# Patient Record
Sex: Female | Born: 1985 | Hispanic: Yes | Marital: Single | Smoking: Never smoker
Health system: Southern US, Community
[De-identification: ages and names within clinical notes are randomized; demographics above are authoritative.]

## PROBLEM LIST (undated history)

## (undated) ENCOUNTER — Inpatient Hospital Stay (HOSPITAL_COMMUNITY): Payer: Self-pay

## (undated) DIAGNOSIS — R87619 Unspecified abnormal cytological findings in specimens from cervix uteri: Secondary | ICD-10-CM

## (undated) DIAGNOSIS — O24419 Gestational diabetes mellitus in pregnancy, unspecified control: Secondary | ICD-10-CM

## (undated) DIAGNOSIS — Q512 Other doubling of uterus, unspecified: Secondary | ICD-10-CM

---

## 2011-06-07 ENCOUNTER — Ambulatory Visit (INDEPENDENT_AMBULATORY_CARE_PROVIDER_SITE_OTHER): Payer: BC Managed Care – PPO | Admitting: Internal Medicine

## 2011-06-07 VITALS — BP 101/67 | HR 71 | Temp 98.3°F | Resp 16 | Ht 59.75 in | Wt 114.4 lb

## 2011-06-07 DIAGNOSIS — N912 Amenorrhea, unspecified: Secondary | ICD-10-CM

## 2011-06-07 LAB — POCT URINE PREGNANCY: Preg Test, Ur: POSITIVE

## 2011-06-07 NOTE — Progress Notes (Signed)
  Subjective:    Patient ID: Samantha Suarez, female    DOB: 10-22-1985, 26 y.o.   MRN: [id number]  HPI26 year old/last menstrual period March 1/had some spotting for 3 days last week with mild cramping but all of that resolved/home pregnancy test today positive/here for confirmation/she does desire to be pregnant and has never been pregnant before No nausea vomiting or breast tenderness    Review of SystemsHealthy on no medication/nonsmoker/almost no alcohol/no illicit drugs   GYN in [place] Objective:   Physical Exam Vital signs stable Affect appropriate       Results for orders placed in visit on 06/07/11  POCT URINE PREGNANCY      Component Value Range   Preg Test, Ur Positive      Assessment & Plan:  Problem #1 amenorrhea with positive pregnancy test  Because of the cramping and spotting last week we will confirm this with a quantitative hCG at her request and then we can use this as a comparison if she develops other bleeding during this first trimester She'll start prenatal vitamins and follow up with her OB/GYN

## 2011-06-09 ENCOUNTER — Telehealth: Payer: Self-pay

## 2011-06-09 NOTE — Telephone Encounter (Signed)
Since you something by result note from the lab section/see if that worked as well as this note/ You can tell her that this level is consistent with the first week of pregnancy If she has continued bleeding or cramping then it might represent a spontaneous miscarriage and we should repeat this lab test after one week

## 2011-06-09 NOTE — Telephone Encounter (Signed)
PT HAD A PREGNANCY TEST DONE ON Saturday AND WOULD LIKE TO KNOW IF THE RESULTS WERE BACK PLEASE CALL [phone]

## 2011-06-09 NOTE — Telephone Encounter (Signed)
Dr. Merla Riches,  Please review and we can notify patient.

## 2012-03-08 LAB — OB RESULTS CONSOLE GC/CHLAMYDIA: Chlamydia: NEGATIVE

## 2012-04-07 LAB — OB RESULTS CONSOLE HEPATITIS B SURFACE ANTIGEN: Hepatitis B Surface Ag: NEGATIVE

## 2012-04-07 LAB — OB RESULTS CONSOLE RUBELLA ANTIBODY, IGM: Rubella: IMMUNE

## 2012-07-30 ENCOUNTER — Other Ambulatory Visit: Payer: Self-pay

## 2012-08-04 ENCOUNTER — Encounter (HOSPITAL_COMMUNITY): Payer: Self-pay

## 2012-08-04 ENCOUNTER — Ambulatory Visit (HOSPITAL_COMMUNITY)
Admission: RE | Admit: 2012-08-04 | Discharge: 2012-08-04 | Disposition: A | Discharge status: Home / Self Care | Payer: BC Managed Care – PPO | Source: Ambulatory Visit | Attending: Obstetrics and Gynecology | Admitting: Obstetrics and Gynecology

## 2012-08-04 ENCOUNTER — Ambulatory Visit (HOSPITAL_COMMUNITY)
Admission: RE | Admit: 2012-08-04 | Discharge: 2012-08-04 | Disposition: A | Discharge status: Home / Self Care | Payer: Medicaid Other | Source: Ambulatory Visit | Attending: Obstetrics and Gynecology | Admitting: Obstetrics and Gynecology

## 2012-08-04 ENCOUNTER — Other Ambulatory Visit (HOSPITAL_COMMUNITY): Payer: Self-pay | Admitting: Obstetrics and Gynecology

## 2012-08-04 VITALS — BP 96/54 | HR 80 | Wt 148.5 lb

## 2012-08-04 DIAGNOSIS — O358XX0 Maternal care for other (suspected) fetal abnormality and damage, not applicable or unspecified: Secondary | ICD-10-CM | POA: Insufficient documentation

## 2012-08-04 DIAGNOSIS — O409XX1 Polyhydramnios, unspecified trimester, fetus 1: Secondary | ICD-10-CM

## 2012-08-04 DIAGNOSIS — Z36 Encounter for antenatal screening of mother: Secondary | ICD-10-CM

## 2012-08-04 DIAGNOSIS — Z363 Encounter for antenatal screening for malformations: Secondary | ICD-10-CM | POA: Insufficient documentation

## 2012-08-04 DIAGNOSIS — Z1389 Encounter for screening for other disorder: Secondary | ICD-10-CM | POA: Insufficient documentation

## 2012-08-04 DIAGNOSIS — O409XX0 Polyhydramnios, unspecified trimester, not applicable or unspecified: Secondary | ICD-10-CM | POA: Insufficient documentation

## 2012-08-04 DIAGNOSIS — O403XX1 Polyhydramnios, third trimester, fetus 1: Secondary | ICD-10-CM

## 2012-08-04 DIAGNOSIS — Q519 Congenital malformation of uterus and cervix, unspecified: Secondary | ICD-10-CM

## 2012-08-04 NOTE — Consult Note (Signed)
MFM consult   27 yr old G2P0010 at [redacted]w[redacted]d referred by Dr. Henderson Cloud for fetal anatomic survey and consult secondary to polyhydramnios seen on outside ultrasound. Patient with a vaginal septum.  Ultrasound today shows: estimated fetal weight is in the 54th%. Posterior placenta without evidence of previa. Polyhydramnios with amniotic fluid index of 28.3cm. Normal transabdominal cervical length. The views of the right ventricular outflow tract and nasal bone are limited. The duodenum appears prominent. The remainder of the limited anatomy survey is normal. Normal biophysical profile of 8/8. Normal middle cerebral artery Doppler studies. Uterus appears either septate or bicornuate.  I counseled the patient as follows:  1. Appropriate fetal growth. 2. Limited anatomy survey: - will reattempt on follow up ultrasound 3. Polyhydramnios: Discussed the most common etiology is idiopathic- 60% of cases. Other possible etiologies include: diabetes (patient had normal one hour glucola test last week- per patient), fetal malformations, genetic abnormalities, fetal neuromuscular disorders, fetal anemia, hydrops (immune or nonimmune), or congenital fetal infection. I discussed that there is normal fetal movement which makes a neuromuscular disorder less likely. Discussed that the prominent duodenum may raise concern for intestinal atresia or obstruction. Discussed diagnosis is not certain but some images are suggestive. Also discussed if this is duodenal atresia this increases the risks of trisomy 76. Patient did not have aneuploidy screening. Discussed options of cell free fetal DNA screening and amniocentesis and the limitations, benefits, and risks of each. After counseling patient opted for cell free fetal DNA which was drawn today. There are no other signs of fetal anemia or hydrops. There are no other fluid collections and the middle cerebral artery Doppler studies are normal. Given the above this is most likely  idiopathic polyhydramnios or possible intestinal atresia/obstruction. However, patient has had infectious serologies drawn by her primary OB; recommend follow up on results.  I discussed polyhydramnios is associated with increased risk of preterm labor, preterm premature rupture of membranes, preterm delivery, uterine atony, cord prolapse, fetal malpresentation, fetal macrosomia, and perinatal mortality. I gave the patient preterm labor precautions.  Given the increase in perinatal mortality I recommend antenatal testing with weekly BPPs until 32 weeks than may switch to twice weekly NSTs with weekly AFI. Recommend repeat ultrasound for fetal growth in 3-4 weeks.  Recommend fetal kick counts. 4. Vaginal septum/ uterine anomaly: I discussed the increased risks in pregnancy associated with uterine anomalies including and increased risk of: - miscarriage - preterm delivery- approximately 20% - intrauterine growth restriction - antepartum and postpartum bleeding - cervical incompetence - abnormal fetal presentation - cesarean delivery  We would recommend close surveillance in pregnancy for signs/symptoms of preterm labor or PPROM. Would recommend serial fetal growth ultrasounds every 4 weeks The vaginal septum should not interfere with a vaginal delivery although recommend extensive physical exam. 5. Follow up in 1 week; fetal growth in 4 weeks.  I spent 45 minutes in face to face consultation with the patient in addition to time spent on the ultrasound.  Eulis Foster, MD

## 2012-08-04 NOTE — Progress Notes (Signed)
[facility] ultrasound  Indication: 27 yr old G2P0010 at [redacted]w[redacted]d for fetal anatomic survey secondary to polyhydramnios seen on outside ultrasound. Patient with a vaginal septum.  Findings: 1. Single intrauterine pregnancy. 2. Estimated fetal weight is in the 54th%. 3. Posterior placenta without evidence of previa. 4. Polyhydramnios with amniotic fluid index of 28.3cm. 5. Normal transabdominal cervical length. 6. The views of the right ventricular outflow tract and nasal bone are limited. 7. The duodenum appears prominent. 8. The remainder of the limited anatomy survey is normal. 9. Normal biophysical profile of 8/8. 10. Normal middle cerebral artery Doppler studies. 11. Uterus appears either septate or bicornuate.  Recommendations: 1. Appropriate fetal growth. 2. Limited anatomy survey: - will reattempt on follow up ultrasound 3. Polyhydramnios: Discussed the most common etiology is idiopathic- 60% of cases. Other possible etiologies include: diabetes (patient had normal one hour glucola test last week- per patient), fetal malformations, genetic abnormalities, fetal neuromuscular disorders, fetal anemia, hydrops (immune or nonimmune), or congenital fetal infection. I discussed that there is normal fetal movement which makes a neuromuscular disorder less likely. Discussed that the prominent duodenum may raise concern for intestinal atresia or obstruction. Discussed diagnosis is not certain but some images are suggestive. Also discussed if this is duodenal atresia this increases the risks of trisomy 39. Patient did not have aneuploidy screening. Discussed options of cell free fetal DNA screening and amniocentesis and the limitations, benefits, and risks of each. After counseling patient opted for cell free fetal DNA which was drawn today. There are no other signs of fetal anemia or hydrops. There are no other fluid collections and the middle cerebral artery Doppler studies are  normal. Given the above this is most likely idiopathic polyhydramnios or possible intestinal atresia/obstruction. However, patient has had infectious serologies drawn by her primary OB; recommend follow up on results.  I discussed polyhydramnios is associated with increased risk of preterm labor, preterm premature rupture of membranes, preterm delivery, uterine atony, cord prolapse, fetal malpresentation, fetal macrosomia, and perinatal mortality. I gave the patient preterm labor precautions.  Given the increase in perinatal mortality I recommend antenatal testing with weekly BPPs until 32 weeks than may switch to twice weekly NSTs with weekly AFI. Recommend repeat ultrasound for fetal growth in 3-4 weeks.  Recommend fetal kick counts. 4. Vaginal septum/ uterine anomaly: I discussed the increased risks in pregnancy associated with uterine anomalies including and increased risk of: - miscarriage - preterm delivery- approximately 20% - intrauterine growth restriction - antepartum and postpartum bleeding - cervical incompetence - abnormal fetal presentation - cesarean delivery  We would recommend close surveillance in pregnancy for signs/symptoms of preterm labor or PPROM. Would recommend serial fetal growth ultrasounds every 4 weeks The vaginal septum should not interfere with a vaginal delivery although recommend extensive physical exam. 5. Follow up in 1 week; fetal growth in 4 weeks.  Eulis Foster, MD

## 2012-08-10 ENCOUNTER — Encounter (HOSPITAL_COMMUNITY): Payer: Self-pay

## 2012-08-12 ENCOUNTER — Telehealth (HOSPITAL_COMMUNITY): Payer: Self-pay

## 2012-08-12 ENCOUNTER — Other Ambulatory Visit: Payer: Self-pay

## 2012-08-12 NOTE — Telephone Encounter (Signed)
Called [facility] to discuss her cell free fetal DNA test results.  Mrs. Samantha Suarez had Panorama testing through [facility].  Testing was offered because of abnormal ultrasound findings.   The patient was identified by name and DOB.  We reviewed that these are within normal limits, showing a less than 1 in 10,000 risk for trisomies 21, 18 and 13, and monosomy X (Turner syndrome).  In addition, the risk for triploidy/vanishing twin and sex chromosome trisomies (47,XXX and 47,XXY) was also low risk.  We discussed that this testing identifies > 99% of pregnancies with trisomy 81, trisomy 14, trisomy 6, sex chromosome trisomies (47,XXX and 47,XXY), and triploidy.  The detection rate for monosomy X is ~92%.  The false positive rate is <0.1% for all conditions. Testing was also consistent with female gender.  She understands that this testing does not identify all genetic conditions.  All questions were answered to her satisfaction, she was encouraged to call with additional questions or concerns.  Despina Arias, MS Certified Genetic Counselor

## 2012-08-13 ENCOUNTER — Ambulatory Visit (HOSPITAL_COMMUNITY)
Admission: RE | Admit: 2012-08-13 | Discharge: 2012-08-13 | Disposition: A | Discharge status: Home / Self Care | Payer: BC Managed Care – PPO | Source: Ambulatory Visit | Attending: Obstetrics and Gynecology | Admitting: Obstetrics and Gynecology

## 2012-08-13 ENCOUNTER — Encounter (HOSPITAL_COMMUNITY): Payer: Self-pay

## 2012-08-13 DIAGNOSIS — Z3689 Encounter for other specified antenatal screening: Secondary | ICD-10-CM | POA: Insufficient documentation

## 2012-08-13 DIAGNOSIS — O409XX0 Polyhydramnios, unspecified trimester, not applicable or unspecified: Secondary | ICD-10-CM | POA: Insufficient documentation

## 2012-08-13 DIAGNOSIS — O409XX1 Polyhydramnios, unspecified trimester, fetus 1: Secondary | ICD-10-CM

## 2012-08-13 DIAGNOSIS — O401XX1 Polyhydramnios, first trimester, fetus 1: Secondary | ICD-10-CM

## 2012-08-13 NOTE — Progress Notes (Signed)
Samantha Suarez  was seen today for an ultrasound appointment.  See full report in [facility].  Impression: Single IUP at 30 6/7 weeks Polyhydramnios (likely idiopathic), low risk cell-free fetal DNA Active fetus - BPP 8/10 (-2 for absent breathing movement); NST reactive Normal appearing stomach; no clear evidence of bowel obstruction on today's study AFI 25.8 cm  Recommendations: Continue weekly BPPs NSTs with weekly fluid checks starting at 32 weeks. Follow up growth scan in 3 weeks.  Alpha Gula, MD

## 2012-08-16 ENCOUNTER — Other Ambulatory Visit (HOSPITAL_COMMUNITY): Payer: Self-pay | Admitting: Obstetrics and Gynecology

## 2012-08-16 DIAGNOSIS — O409XX1 Polyhydramnios, unspecified trimester, fetus 1: Secondary | ICD-10-CM

## 2012-08-19 ENCOUNTER — Encounter (HOSPITAL_COMMUNITY): Payer: Self-pay

## 2012-08-19 ENCOUNTER — Ambulatory Visit (HOSPITAL_COMMUNITY)
Admission: RE | Admit: 2012-08-19 | Discharge: 2012-08-19 | Disposition: A | Discharge status: Home / Self Care | Payer: BC Managed Care – PPO | Source: Ambulatory Visit | Attending: Obstetrics and Gynecology | Admitting: Obstetrics and Gynecology

## 2012-08-19 VITALS — BP 97/57 | HR 83 | Wt 150.0 lb

## 2012-08-19 DIAGNOSIS — O409XX0 Polyhydramnios, unspecified trimester, not applicable or unspecified: Secondary | ICD-10-CM | POA: Insufficient documentation

## 2012-08-19 DIAGNOSIS — O409XX1 Polyhydramnios, unspecified trimester, fetus 1: Secondary | ICD-10-CM

## 2012-08-19 DIAGNOSIS — Z3689 Encounter for other specified antenatal screening: Secondary | ICD-10-CM | POA: Insufficient documentation

## 2012-08-24 ENCOUNTER — Other Ambulatory Visit (HOSPITAL_COMMUNITY): Payer: Self-pay | Admitting: Obstetrics and Gynecology

## 2012-08-24 DIAGNOSIS — O409XX1 Polyhydramnios, unspecified trimester, fetus 1: Secondary | ICD-10-CM

## 2012-08-27 ENCOUNTER — Ambulatory Visit (HOSPITAL_COMMUNITY): Admission: RE | Admit: 2012-08-27 | Payer: BC Managed Care – PPO | Source: Ambulatory Visit

## 2012-08-31 ENCOUNTER — Other Ambulatory Visit (HOSPITAL_COMMUNITY): Payer: Self-pay | Admitting: Obstetrics and Gynecology

## 2012-08-31 DIAGNOSIS — O401XX1 Polyhydramnios, first trimester, fetus 1: Secondary | ICD-10-CM

## 2012-09-01 ENCOUNTER — Other Ambulatory Visit (HOSPITAL_COMMUNITY): Payer: Self-pay | Admitting: Obstetrics and Gynecology

## 2012-09-01 DIAGNOSIS — O401XX1 Polyhydramnios, first trimester, fetus 1: Secondary | ICD-10-CM

## 2012-09-02 ENCOUNTER — Encounter (HOSPITAL_COMMUNITY): Payer: Self-pay

## 2012-09-02 ENCOUNTER — Ambulatory Visit (HOSPITAL_COMMUNITY)
Admission: RE | Admit: 2012-09-02 | Discharge: 2012-09-02 | Disposition: A | Discharge status: Home / Self Care | Payer: BC Managed Care – PPO | Source: Ambulatory Visit | Attending: Obstetrics and Gynecology | Admitting: Obstetrics and Gynecology

## 2012-09-02 DIAGNOSIS — O409XX0 Polyhydramnios, unspecified trimester, not applicable or unspecified: Secondary | ICD-10-CM | POA: Insufficient documentation

## 2012-09-02 DIAGNOSIS — Z3689 Encounter for other specified antenatal screening: Secondary | ICD-10-CM | POA: Insufficient documentation

## 2012-09-02 DIAGNOSIS — O401XX1 Polyhydramnios, first trimester, fetus 1: Secondary | ICD-10-CM

## 2012-09-08 ENCOUNTER — Other Ambulatory Visit (HOSPITAL_COMMUNITY): Payer: Self-pay | Admitting: Obstetrics and Gynecology

## 2012-09-08 DIAGNOSIS — O409XX1 Polyhydramnios, unspecified trimester, fetus 1: Secondary | ICD-10-CM

## 2012-09-10 ENCOUNTER — Encounter (HOSPITAL_COMMUNITY): Payer: Self-pay

## 2012-09-10 ENCOUNTER — Ambulatory Visit (HOSPITAL_COMMUNITY)
Admission: RE | Admit: 2012-09-10 | Discharge: 2012-09-10 | Disposition: A | Discharge status: Home / Self Care | Payer: BC Managed Care – PPO | Source: Ambulatory Visit | Attending: Obstetrics and Gynecology | Admitting: Obstetrics and Gynecology

## 2012-09-10 VITALS — BP 105/66 | HR 79 | Wt 153.0 lb

## 2012-09-10 DIAGNOSIS — O409XX1 Polyhydramnios, unspecified trimester, fetus 1: Secondary | ICD-10-CM

## 2012-09-10 DIAGNOSIS — Z3689 Encounter for other specified antenatal screening: Secondary | ICD-10-CM | POA: Insufficient documentation

## 2012-09-10 DIAGNOSIS — O409XX0 Polyhydramnios, unspecified trimester, not applicable or unspecified: Secondary | ICD-10-CM | POA: Insufficient documentation

## 2012-09-13 LAB — OB RESULTS CONSOLE GBS: GBS: NEGATIVE

## 2012-09-14 ENCOUNTER — Other Ambulatory Visit (HOSPITAL_COMMUNITY): Payer: Self-pay | Admitting: Obstetrics and Gynecology

## 2012-09-14 DIAGNOSIS — O403XX1 Polyhydramnios, third trimester, fetus 1: Secondary | ICD-10-CM

## 2012-09-17 ENCOUNTER — Ambulatory Visit (HOSPITAL_COMMUNITY)
Admission: RE | Admit: 2012-09-17 | Discharge: 2012-09-17 | Disposition: A | Discharge status: Home / Self Care | Payer: BC Managed Care – PPO | Source: Ambulatory Visit | Attending: Obstetrics and Gynecology | Admitting: Obstetrics and Gynecology

## 2012-09-17 VITALS — BP 107/67 | HR 70 | Wt 153.0 lb

## 2012-09-17 DIAGNOSIS — Z3689 Encounter for other specified antenatal screening: Secondary | ICD-10-CM | POA: Insufficient documentation

## 2012-09-17 DIAGNOSIS — O409XX0 Polyhydramnios, unspecified trimester, not applicable or unspecified: Secondary | ICD-10-CM | POA: Insufficient documentation

## 2012-09-17 DIAGNOSIS — O403XX1 Polyhydramnios, third trimester, fetus 1: Secondary | ICD-10-CM

## 2012-09-24 ENCOUNTER — Ambulatory Visit (HOSPITAL_COMMUNITY)
Admission: RE | Admit: 2012-09-24 | Discharge: 2012-09-24 | Disposition: A | Discharge status: Home / Self Care | Payer: BC Managed Care – PPO | Source: Ambulatory Visit | Attending: Obstetrics and Gynecology | Admitting: Obstetrics and Gynecology

## 2012-09-24 VITALS — BP 120/67 | HR 77 | Wt 155.0 lb

## 2012-09-24 DIAGNOSIS — O409XX0 Polyhydramnios, unspecified trimester, not applicable or unspecified: Secondary | ICD-10-CM | POA: Insufficient documentation

## 2012-09-24 DIAGNOSIS — Z3689 Encounter for other specified antenatal screening: Secondary | ICD-10-CM | POA: Insufficient documentation

## 2012-09-24 DIAGNOSIS — O403XX1 Polyhydramnios, third trimester, fetus 1: Secondary | ICD-10-CM

## 2012-09-24 NOTE — Progress Notes (Signed)
Samantha Suarez  was seen today for an ultrasound appointment.  See full report in [facility].  Impression: IUP at 36+6 weeks  MIld polyhydramnios (AFI = 30 cm) Active fetus with BPP 8/8   Recommendations: Follow up next week for BPP and growth. Continue antepartum fetal testing - recommend delivery at 39 weeeks.  Alpha Gula, MD

## 2012-09-29 ENCOUNTER — Telehealth (HOSPITAL_COMMUNITY): Payer: Self-pay | Admitting: *Deleted

## 2012-09-29 ENCOUNTER — Encounter (HOSPITAL_COMMUNITY): Payer: Self-pay | Admitting: *Deleted

## 2012-09-29 ENCOUNTER — Other Ambulatory Visit (HOSPITAL_COMMUNITY): Payer: Self-pay | Admitting: Maternal and Fetal Medicine

## 2012-09-29 DIAGNOSIS — O403XX1 Polyhydramnios, third trimester, fetus 1: Secondary | ICD-10-CM

## 2012-09-29 NOTE — Telephone Encounter (Signed)
Preadmission screen  

## 2012-10-01 ENCOUNTER — Ambulatory Visit (HOSPITAL_COMMUNITY)
Admission: RE | Admit: 2012-10-01 | Discharge: 2012-10-01 | Disposition: A | Discharge status: Home / Self Care | Payer: BC Managed Care – PPO | Source: Ambulatory Visit | Attending: Obstetrics and Gynecology | Admitting: Obstetrics and Gynecology

## 2012-10-01 VITALS — BP 112/70 | HR 70 | Wt 154.8 lb

## 2012-10-01 DIAGNOSIS — O409XX0 Polyhydramnios, unspecified trimester, not applicable or unspecified: Secondary | ICD-10-CM | POA: Insufficient documentation

## 2012-10-01 DIAGNOSIS — O403XX1 Polyhydramnios, third trimester, fetus 1: Secondary | ICD-10-CM

## 2012-10-01 DIAGNOSIS — Z3689 Encounter for other specified antenatal screening: Secondary | ICD-10-CM | POA: Insufficient documentation

## 2012-10-01 NOTE — Progress Notes (Signed)
Samantha Suarez  was seen today for an ultrasound appointment.  See full report in [facility].  Impression: IUP at 37+6 weeks  MIld polyhydramnios (AFI = 30 cm) - likely idiopathic Interval growth is appropriate (76th %tile) Stomach bubble appears normal.  No dilated loops of bowel - no obvious cause of polyhydramnios. Active fetus with BPP 8/8   Recommendations: Continue antepartum fetal testing as scheduled - to return for BPP next week Tentatively scheduled for induction of labor at 39 weeks.  Alpha Gula, MD

## 2012-10-03 ENCOUNTER — Inpatient Hospital Stay (HOSPITAL_COMMUNITY)
Admission: AD | Admit: 2012-10-03 | Discharge: 2012-10-03 | Disposition: A | Discharge status: Home / Self Care | Payer: BC Managed Care – PPO | Source: Ambulatory Visit | Attending: Obstetrics and Gynecology | Admitting: Obstetrics and Gynecology

## 2012-10-03 ENCOUNTER — Encounter (HOSPITAL_COMMUNITY): Payer: Self-pay | Admitting: *Deleted

## 2012-10-03 ENCOUNTER — Inpatient Hospital Stay (HOSPITAL_COMMUNITY): Payer: BC Managed Care – PPO

## 2012-10-03 DIAGNOSIS — O409XX0 Polyhydramnios, unspecified trimester, not applicable or unspecified: Secondary | ICD-10-CM | POA: Insufficient documentation

## 2012-10-03 DIAGNOSIS — L299 Pruritus, unspecified: Secondary | ICD-10-CM | POA: Insufficient documentation

## 2012-10-03 DIAGNOSIS — L309 Dermatitis, unspecified: Secondary | ICD-10-CM

## 2012-10-03 DIAGNOSIS — O9989 Other specified diseases and conditions complicating pregnancy, childbirth and the puerperium: Secondary | ICD-10-CM | POA: Insufficient documentation

## 2012-10-03 MED ORDER — PREDNISONE (PAK) 10 MG PO TABS: 10.0000 mg | ORAL_TABLET | Freq: Every day | ORAL | Status: DC

## 2012-10-03 MED ORDER — HYDROCORTISONE 1 % EX LOTN: TOPICAL_LOTION | Freq: Two times a day (BID) | CUTANEOUS | Status: DC

## 2012-10-03 NOTE — MAU Provider Note (Signed)
  History     CSN: [id number]  Arrival date and time: 10/03/12 [facility]   None     Chief Complaint  Patient presents with  . Rash   HPI This is a 27 y.o. female at [redacted]w[redacted]d who presents with c/o rash that first appeared on 7/29.  States is spreading. Started on abdomen and now has small areas on legs and arms. + itching. Has been followed for mild polyhydramnios, believed to be idiopathic IOL planned for 39 weeks. Wants to be induced today  RN Note: Patient states she has had a rash since 7-29 that is getting worse and spreading all over her body. Reports good fetal movement, maybe having some contractions, no bleeding or leaking.       OB History   Grav Para Term Preterm Abortions TAB SAB Ect Mult Living   2 0 0 0 1 0 1 0 0 0       Past Medical History  Diagnosis Date  . Medical history non-contributory     Past Surgical History  Procedure Laterality Date  . No past surgeries      No family history on file.  History  Substance Use Topics  . Smoking status: Never Smoker   . Smokeless tobacco: Never Used  . Alcohol Use: No    Allergies: No Known Allergies  No prescriptions prior to admission    Review of Systems  Constitutional: Positive for malaise/fatigue. Negative for fever and chills.  Gastrointestinal: Negative for nausea, vomiting, abdominal pain, diarrhea and constipation.  Genitourinary: Negative for dysuria.  Skin: Positive for itching and rash.  Neurological: Negative for headaches.   Physical Exam   Blood pressure 120/71, pulse 72, temperature 98 F (36.7 C), temperature source Oral, resp. rate 18, height 4\' 8"  (1.422 m), weight 70.943 kg (156 lb 6.4 oz), last menstrual period 01/10/2012.  Physical Exam  Constitutional: She is oriented to person, place, and time. She appears well-developed and well-nourished. No distress.  HENT:  Head: Normocephalic.  Cardiovascular: Normal rate and regular rhythm.  Exam reveals no gallop and no friction rub.   No  murmur heard. Respiratory: Effort normal and breath sounds normal. No respiratory distress. She has no wheezes. She has no rales. She exhibits no tenderness.  O2 saturation 100% on room air  GI: Soft. There is no tenderness.  Musculoskeletal: Normal range of motion. She exhibits no edema.  Neurological: She is alert and oriented to person, place, and time.  Skin: Skin is warm and dry. Rash noted.  Maculopapular rash on abdomen, mostly involving striae Small papules on thighs Has a few scattered papules on arms No rash on back   Psychiatric: She has a normal mood and affect.   Fetal heart rate reactive Rare contractions  MAU Course  Procedures  MDM Discussed with Dr Henderson Cloud. Will check [place] for AFI and BPP.  >> BPP 8/8 with AFI = 27   Assessment and Plan  A:  SIUP at [redacted]w[redacted]d        PUPPS rash       Polyhydramnios       Reassuring fetal testing  P:  Discussed with Dr Henderson Cloud       Discharge home       Keep appt with office Tuesday         [facility] 10/03/2012, 8:47 AM

## 2012-10-03 NOTE — MAU Note (Signed)
Patient states she has had a rash since 7-29 that is getting worse and spreading all over her body. Reports good fetal movement, maybe having some contractions, no bleeding or leaking.

## 2012-10-08 ENCOUNTER — Ambulatory Visit (HOSPITAL_COMMUNITY)
Admission: RE | Admit: 2012-10-08 | Discharge: 2012-10-08 | Disposition: A | Discharge status: Home / Self Care | Payer: BC Managed Care – PPO | Source: Ambulatory Visit | Attending: Obstetrics and Gynecology | Admitting: Obstetrics and Gynecology

## 2012-10-08 ENCOUNTER — Other Ambulatory Visit (HOSPITAL_COMMUNITY): Payer: Self-pay | Admitting: Maternal and Fetal Medicine

## 2012-10-08 DIAGNOSIS — O403XX1 Polyhydramnios, third trimester, fetus 1: Secondary | ICD-10-CM

## 2012-10-08 DIAGNOSIS — O409XX0 Polyhydramnios, unspecified trimester, not applicable or unspecified: Secondary | ICD-10-CM | POA: Insufficient documentation

## 2012-10-08 DIAGNOSIS — Z3689 Encounter for other specified antenatal screening: Secondary | ICD-10-CM | POA: Insufficient documentation

## 2012-10-10 ENCOUNTER — Inpatient Hospital Stay (HOSPITAL_COMMUNITY)
Admission: RE | Admit: 2012-10-10 | Discharge: 2012-10-14 | DRG: 371 | Disposition: A | Discharge status: Home / Self Care | Payer: BC Managed Care – PPO | Source: Ambulatory Visit | Attending: Obstetrics and Gynecology | Admitting: Obstetrics and Gynecology

## 2012-10-10 ENCOUNTER — Inpatient Hospital Stay (HOSPITAL_COMMUNITY): Payer: BC Managed Care – PPO

## 2012-10-10 ENCOUNTER — Encounter (HOSPITAL_COMMUNITY): Payer: Self-pay

## 2012-10-10 DIAGNOSIS — O324XX0 Maternal care for high head at term, not applicable or unspecified: Secondary | ICD-10-CM | POA: Diagnosis present

## 2012-10-10 DIAGNOSIS — Q5211 Transverse vaginal septum: Secondary | ICD-10-CM

## 2012-10-10 DIAGNOSIS — O409XX0 Polyhydramnios, unspecified trimester, not applicable or unspecified: Principal | ICD-10-CM | POA: Diagnosis present

## 2012-10-10 DIAGNOSIS — O3460 Maternal care for abnormality of vagina, unspecified trimester: Secondary | ICD-10-CM | POA: Diagnosis present

## 2012-10-10 LAB — CBC
MCV: 89.1 fL (ref 78.0–100.0)
Platelets: 234 10*3/uL (ref 150–400)
RDW: 13.3 % (ref 11.5–15.5)
WBC: 10.2 10*3/uL (ref 4.0–10.5)

## 2012-10-10 MED ORDER — ZOLPIDEM TARTRATE 5 MG PO TABS
5.0000 mg | ORAL_TABLET | Freq: Every evening | ORAL | Status: DC | PRN
  Administered 2012-10-10: 5 mg via ORAL
  Filled 2012-10-10: qty 1

## 2012-10-10 MED ORDER — LACTATED RINGERS IV SOLN
INTRAVENOUS | Status: DC
  Administered 2012-10-10 – 2012-10-12 (×6): via INTRAVENOUS

## 2012-10-10 MED ORDER — ACETAMINOPHEN 325 MG PO TABS: 650.0000 mg | ORAL_TABLET | ORAL | Status: DC | PRN

## 2012-10-10 MED ORDER — BUTORPHANOL TARTRATE 1 MG/ML IJ SOLN: 1.0000 mg | INTRAMUSCULAR | Status: DC | PRN

## 2012-10-10 MED ORDER — ONDANSETRON HCL 4 MG/2ML IJ SOLN: 4.0000 mg | Freq: Four times a day (QID) | INTRAMUSCULAR | Status: DC | PRN

## 2012-10-10 MED ORDER — TERBUTALINE SULFATE 1 MG/ML IJ SOLN: 0.2500 mg | Freq: Once | INTRAMUSCULAR | Status: AC | PRN

## 2012-10-10 MED ORDER — LIDOCAINE HCL (PF) 1 % IJ SOLN
30.0000 mL | INTRAMUSCULAR | Status: DC | PRN
  Filled 2012-10-10: qty 30

## 2012-10-10 MED ORDER — OXYTOCIN 40 UNITS IN LACTATED RINGERS INFUSION - SIMPLE MED: 62.5000 mL/h | INTRAVENOUS | Status: DC

## 2012-10-10 MED ORDER — OXYCODONE-ACETAMINOPHEN 5-325 MG PO TABS: 1.0000 | ORAL_TABLET | ORAL | Status: DC | PRN

## 2012-10-10 MED ORDER — MISOPROSTOL 25 MCG QUARTER TABLET
25.0000 ug | ORAL_TABLET | ORAL | Status: DC | PRN
  Administered 2012-10-10: 25 ug via VAGINAL
  Filled 2012-10-10: qty 0.25

## 2012-10-10 MED ORDER — FLEET ENEMA 7-19 GM/118ML RE ENEM: 1.0000 | ENEMA | RECTAL | Status: DC | PRN

## 2012-10-10 MED ORDER — OXYTOCIN BOLUS FROM INFUSION: 500.0000 mL | INTRAVENOUS | Status: DC

## 2012-10-10 MED ORDER — LACTATED RINGERS IV SOLN
500.0000 mL | INTRAVENOUS | Status: DC | PRN
  Administered 2012-10-11: 300 mL via INTRAVENOUS

## 2012-10-10 MED ORDER — CITRIC ACID-SODIUM CITRATE 334-500 MG/5ML PO SOLN
30.0000 mL | ORAL | Status: DC | PRN
  Administered 2012-10-12: 30 mL via ORAL
  Filled 2012-10-10: qty 15

## 2012-10-10 MED ORDER — IBUPROFEN 600 MG PO TABS: 600.0000 mg | ORAL_TABLET | Freq: Four times a day (QID) | ORAL | Status: DC | PRN

## 2012-10-10 NOTE — H&P (Signed)
27 y.o. G2P0010  Estimated Date of Delivery: 10/16/12 admitted at 39/[redacted] weeks gestation for induction for mild, idiopathic polyhydramnios at the recommendation of MFM consult Verlin Fester, MD).  Prenatal Transfer Tool  Maternal Diabetes: No Genetic Screening: Declined Maternal Ultrasounds/Referrals: Mild polyhydramnios.  No fetal or maternal pathology noted. Fetal Ultrasounds or other Referrals: MFM consult for polyhydramnios. Maternal Substance Abuse:  No Significant Maternal Medications:  None Significant Maternal Lab Results: None Other Significant Pregnancy Complications:  None  Afebrile, VSS Heart and Lungs: No active disease Abdomen: soft, gravid, EFW 8 lbs. Cervical exam:  1/50  Impression: Mild polyhydramnios  Plan:  Induction at 39 weeks.

## 2012-10-10 NOTE — Plan of Care (Signed)
Problem: Consults Goal: Birthing Suites Patient Information Press F2 to bring up selections list Outcome: Completed/Met Date Met:  10/10/12  Pt 37-[redacted] weeks EGA and Inpatient induction

## 2012-10-11 ENCOUNTER — Inpatient Hospital Stay (HOSPITAL_COMMUNITY): Payer: BC Managed Care – PPO | Admitting: Anesthesiology

## 2012-10-11 ENCOUNTER — Encounter (HOSPITAL_COMMUNITY): Payer: Self-pay | Admitting: Anesthesiology

## 2012-10-11 LAB — RPR: RPR Ser Ql: NONREACTIVE

## 2012-10-11 LAB — TYPE AND SCREEN: Antibody Screen: NEGATIVE

## 2012-10-11 MED ORDER — PHENYLEPHRINE 40 MCG/ML (10ML) SYRINGE FOR IV PUSH (FOR BLOOD PRESSURE SUPPORT): 80.0000 ug | PREFILLED_SYRINGE | INTRAVENOUS | Status: DC | PRN

## 2012-10-11 MED ORDER — DIPHENHYDRAMINE HCL 50 MG/ML IJ SOLN: 12.5000 mg | INTRAMUSCULAR | Status: DC | PRN

## 2012-10-11 MED ORDER — FENTANYL 2.5 MCG/ML BUPIVACAINE 1/10 % EPIDURAL INFUSION (WH - ANES)
14.0000 mL/h | INTRAMUSCULAR | Status: DC | PRN
  Administered 2012-10-11 (×2): 14 mL/h via EPIDURAL
  Filled 2012-10-11 (×2): qty 125

## 2012-10-11 MED ORDER — EPHEDRINE 5 MG/ML INJ: 10.0000 mg | INTRAVENOUS | Status: DC | PRN

## 2012-10-11 MED ORDER — LIDOCAINE HCL (PF) 1 % IJ SOLN
INTRAMUSCULAR | Status: DC | PRN
  Administered 2012-10-11 (×2): 5 mL

## 2012-10-11 MED ORDER — TERBUTALINE SULFATE 1 MG/ML IJ SOLN: 0.2500 mg | Freq: Once | INTRAMUSCULAR | Status: AC | PRN

## 2012-10-11 MED ORDER — PHENYLEPHRINE 40 MCG/ML (10ML) SYRINGE FOR IV PUSH (FOR BLOOD PRESSURE SUPPORT)
80.0000 ug | PREFILLED_SYRINGE | INTRAVENOUS | Status: DC | PRN
  Filled 2012-10-11: qty 5

## 2012-10-11 MED ORDER — LACTATED RINGERS IV SOLN
500.0000 mL | Freq: Once | INTRAVENOUS | Status: AC
  Administered 2012-10-11: 500 mL via INTRAVENOUS

## 2012-10-11 MED ORDER — OXYTOCIN 40 UNITS IN LACTATED RINGERS INFUSION - SIMPLE MED: 1.0000 m[IU]/min | INTRAVENOUS | Status: DC

## 2012-10-11 MED ORDER — OXYTOCIN 40 UNITS IN LACTATED RINGERS INFUSION - SIMPLE MED
1.0000 m[IU]/min | INTRAVENOUS | Status: DC
  Administered 2012-10-11: 1 m[IU]/min via INTRAVENOUS
  Filled 2012-10-11: qty 1000

## 2012-10-11 MED ORDER — EPHEDRINE 5 MG/ML INJ
10.0000 mg | INTRAVENOUS | Status: DC | PRN
  Filled 2012-10-11: qty 4

## 2012-10-11 NOTE — Anesthesia Preprocedure Evaluation (Addendum)
Anesthesia Evaluation  Patient identified by MRN, date of birth, ID band Patient awake    Reviewed: Allergy & Precautions, H&P , NPO status , Patient's Chart, lab work & pertinent test results  Airway Mallampati: II TM Distance: >3 FB Neck ROM: full    Dental no notable dental hx.    Pulmonary neg pulmonary ROS,  breath sounds clear to auscultation  Pulmonary exam normal       Cardiovascular negative cardio ROS  Rhythm:regular Rate:Normal     Neuro/Psych negative neurological ROS  negative psych ROS   GI/Hepatic negative GI ROS, Neg liver ROS,   Endo/Other  negative endocrine ROS  Renal/GU negative Renal ROS     Musculoskeletal negative musculoskeletal ROS (+)   Abdominal   Peds  Hematology negative hematology ROS (+)   Anesthesia Other Findings   Reproductive/Obstetrics (+) Pregnancy                          Anesthesia Physical Anesthesia Plan  ASA: II and emergent  Anesthesia Plan: Epidural   Post-op Pain Management:    Induction:   Airway Management Planned:   Additional Equipment:   Intra-op Plan:   Post-operative Plan:   Informed Consent: I have reviewed the patients History and Physical, chart, labs and discussed the procedure including the risks, benefits and alternatives for the proposed anesthesia with the patient or authorized representative who has indicated his/her understanding and acceptance.     Plan Discussed with: Anesthesiologist, CRNA and Surgeon  Anesthesia Plan Comments: (Patient for C/Section for arrest of descent. )       Anesthesia Quick Evaluation

## 2012-10-11 NOTE — Anesthesia Procedure Notes (Signed)
Epidural Patient location during procedure: OB Start time: 10/11/2012 1:10 PM  Staffing Anesthesiologist: Angus Seller., [facility] R. Performed by: anesthesiologist   Preanesthetic Checklist Completed: patient identified, site marked, surgical consent, pre-op evaluation, timeout performed, IV checked, risks and benefits discussed and monitors and equipment checked  Epidural Patient position: sitting Prep: site prepped and draped and DuraPrep Patient monitoring: continuous pulse ox and blood pressure Approach: midline Injection technique: LOR air and LOR saline  Needle:  Needle type: Tuohy  Needle gauge: 17 G Needle length: 9 cm and 9 Needle insertion depth: 5 cm cm Catheter type: closed end flexible Catheter size: 19 Gauge Catheter at skin depth: 10 cm Test dose: negative  Assessment Events: blood not aspirated, injection not painful, no injection resistance, negative IV test and no paresthesia  Additional Notes Patient identified.  Risk benefits discussed including failed block, incomplete pain control, headache, nerve damage, paralysis, blood pressure changes, nausea, vomiting, reactions to medication both toxic or allergic, and postpartum back pain.  Patient expressed understanding and wished to proceed.  All questions were answered.  Sterile technique used throughout procedure and epidural site dressed with sterile barrier dressing. No paresthesia or other complications noted.The patient did not experience any signs of intravascular injection such as tinnitus or metallic taste in mouth nor signs of intrathecal spread such as rapid motor block. Please see nursing notes for vital signs.

## 2012-10-12 ENCOUNTER — Encounter (HOSPITAL_COMMUNITY)
Admission: RE | Disposition: A | Discharge status: Home / Self Care | Payer: Self-pay | Source: Ambulatory Visit | Attending: Obstetrics and Gynecology

## 2012-10-12 ENCOUNTER — Encounter (HOSPITAL_COMMUNITY): Payer: Self-pay

## 2012-10-12 LAB — CBC
HCT: 32.7 % — ABNORMAL LOW (ref 36.0–46.0)
MCV: 89.3 fL (ref 78.0–100.0)
Platelets: 181 10*3/uL (ref 150–400)
RBC: 3.66 MIL/uL — ABNORMAL LOW (ref 3.87–5.11)
WBC: 22.4 10*3/uL — ABNORMAL HIGH (ref 4.0–10.5)

## 2012-10-12 SURGERY — Surgical Case
Anesthesia: Epidural | Site: Abdomen | Wound class: Clean Contaminated

## 2012-10-12 MED ORDER — SODIUM CHLORIDE 0.9 % IJ SOLN: 3.0000 mL | INTRAMUSCULAR | Status: DC | PRN

## 2012-10-12 MED ORDER — ONDANSETRON HCL 4 MG/2ML IJ SOLN
INTRAMUSCULAR | Status: DC | PRN
  Administered 2012-10-12: 4 mg via INTRAVENOUS

## 2012-10-12 MED ORDER — MORPHINE SULFATE (PF) 0.5 MG/ML IJ SOLN
INTRAMUSCULAR | Status: DC | PRN
  Administered 2012-10-12: 1 mg via INTRAVENOUS

## 2012-10-12 MED ORDER — LACTATED RINGERS IV SOLN: INTRAVENOUS | Status: DC

## 2012-10-12 MED ORDER — TETANUS-DIPHTH-ACELL PERTUSSIS 5-2.5-18.5 LF-MCG/0.5 IM SUSP
0.5000 mL | Freq: Once | INTRAMUSCULAR | Status: AC
Start: 2012-10-13 — End: 2012-10-13
  Administered 2012-10-13: 0.5 mL via INTRAMUSCULAR

## 2012-10-12 MED ORDER — NALBUPHINE HCL 10 MG/ML IJ SOLN
5.0000 mg | INTRAMUSCULAR | Status: DC | PRN
  Filled 2012-10-12: qty 1

## 2012-10-12 MED ORDER — IBUPROFEN 600 MG PO TABS
600.0000 mg | ORAL_TABLET | Freq: Four times a day (QID) | ORAL | Status: DC
  Administered 2012-10-12 – 2012-10-14 (×8): 600 mg via ORAL
  Filled 2012-10-12 (×2): qty 1
  Filled 2012-10-12: qty 2
  Filled 2012-10-12 (×5): qty 1

## 2012-10-12 MED ORDER — LIDOCAINE-EPINEPHRINE (PF) 2 %-1:200000 IJ SOLN
INTRAMUSCULAR | Status: AC
  Filled 2012-10-12: qty 20

## 2012-10-12 MED ORDER — LACTATED RINGERS IV SOLN
INTRAVENOUS | Status: DC | PRN
  Administered 2012-10-12: 02:00:00 via INTRAVENOUS

## 2012-10-12 MED ORDER — SIMETHICONE 80 MG PO CHEW: 80.0000 mg | CHEWABLE_TABLET | ORAL | Status: DC | PRN

## 2012-10-12 MED ORDER — KETOROLAC TROMETHAMINE 30 MG/ML IJ SOLN
INTRAMUSCULAR | Status: AC
  Administered 2012-10-12: 30 mg via INTRAMUSCULAR
  Filled 2012-10-12: qty 1

## 2012-10-12 MED ORDER — MORPHINE SULFATE 0.5 MG/ML IJ SOLN
INTRAMUSCULAR | Status: AC
  Filled 2012-10-12: qty 10

## 2012-10-12 MED ORDER — CEFAZOLIN SODIUM-DEXTROSE 2-3 GM-% IV SOLR
INTRAVENOUS | Status: AC
  Filled 2012-10-12: qty 50

## 2012-10-12 MED ORDER — SCOPOLAMINE 1 MG/3DAYS TD PT72
MEDICATED_PATCH | TRANSDERMAL | Status: AC
  Administered 2012-10-12: 1.5 mg via TRANSDERMAL
  Filled 2012-10-12: qty 1

## 2012-10-12 MED ORDER — MEPERIDINE HCL 25 MG/ML IJ SOLN: 6.2500 mg | INTRAMUSCULAR | Status: DC | PRN

## 2012-10-12 MED ORDER — LIDOCAINE HCL 1 % IJ SOLN
INTRAMUSCULAR | Status: DC | PRN
  Administered 2012-10-12: 6 mL

## 2012-10-12 MED ORDER — FENTANYL CITRATE 0.05 MG/ML IJ SOLN
INTRAMUSCULAR | Status: DC | PRN
  Administered 2012-10-12: 100 ug via INTRAVENOUS

## 2012-10-12 MED ORDER — SODIUM BICARBONATE 8.4 % IV SOLN
INTRAVENOUS | Status: AC
  Filled 2012-10-12: qty 50

## 2012-10-12 MED ORDER — NALOXONE HCL 0.4 MG/ML IJ SOLN: 0.4000 mg | INTRAMUSCULAR | Status: DC | PRN

## 2012-10-12 MED ORDER — METOCLOPRAMIDE HCL 5 MG/ML IJ SOLN: 10.0000 mg | Freq: Three times a day (TID) | INTRAMUSCULAR | Status: DC | PRN

## 2012-10-12 MED ORDER — ONDANSETRON HCL 4 MG/2ML IJ SOLN: 4.0000 mg | INTRAMUSCULAR | Status: DC | PRN

## 2012-10-12 MED ORDER — MEPERIDINE HCL 25 MG/ML IJ SOLN
INTRAMUSCULAR | Status: AC
  Filled 2012-10-12: qty 1

## 2012-10-12 MED ORDER — SENNOSIDES-DOCUSATE SODIUM 8.6-50 MG PO TABS
2.0000 | ORAL_TABLET | Freq: Every day | ORAL | Status: DC
  Administered 2012-10-12 – 2012-10-13 (×2): 2 via ORAL

## 2012-10-12 MED ORDER — ONDANSETRON HCL 4 MG PO TABS: 4.0000 mg | ORAL_TABLET | ORAL | Status: DC | PRN

## 2012-10-12 MED ORDER — PHENYLEPHRINE HCL 10 MG/ML IJ SOLN
INTRAMUSCULAR | Status: DC | PRN
  Administered 2012-10-12 (×2): 80 ug via INTRAVENOUS

## 2012-10-12 MED ORDER — SIMETHICONE 80 MG PO CHEW
80.0000 mg | CHEWABLE_TABLET | Freq: Three times a day (TID) | ORAL | Status: DC
  Administered 2012-10-12 – 2012-10-14 (×7): 80 mg via ORAL

## 2012-10-12 MED ORDER — DIPHENHYDRAMINE HCL 50 MG/ML IJ SOLN: 25.0000 mg | INTRAMUSCULAR | Status: DC | PRN

## 2012-10-12 MED ORDER — SCOPOLAMINE 1 MG/3DAYS TD PT72: 1.0000 | MEDICATED_PATCH | Freq: Once | TRANSDERMAL | Status: DC

## 2012-10-12 MED ORDER — SODIUM BICARBONATE 8.4 % IV SOLN
INTRAVENOUS | Status: DC | PRN
  Administered 2012-10-12: 5 mL via EPIDURAL

## 2012-10-12 MED ORDER — CEFAZOLIN SODIUM-DEXTROSE 2-3 GM-% IV SOLR
INTRAVENOUS | Status: DC | PRN
  Administered 2012-10-12: 2 g via INTRAVENOUS

## 2012-10-12 MED ORDER — DIPHENHYDRAMINE HCL 25 MG PO CAPS: 25.0000 mg | ORAL_CAPSULE | ORAL | Status: DC | PRN

## 2012-10-12 MED ORDER — NALOXONE HCL 1 MG/ML IJ SOLN
1.0000 ug/kg/h | INTRAVENOUS | Status: DC | PRN
  Filled 2012-10-12: qty 2

## 2012-10-12 MED ORDER — FENTANYL CITRATE 0.05 MG/ML IJ SOLN: 25.0000 ug | INTRAMUSCULAR | Status: DC | PRN

## 2012-10-12 MED ORDER — FENTANYL CITRATE 0.05 MG/ML IJ SOLN
INTRAMUSCULAR | Status: AC
  Filled 2012-10-12: qty 2

## 2012-10-12 MED ORDER — PRENATAL MULTIVITAMIN CH
1.0000 | ORAL_TABLET | Freq: Every day | ORAL | Status: DC
  Administered 2012-10-13 – 2012-10-14 (×2): 1 via ORAL
  Filled 2012-10-12 (×2): qty 1

## 2012-10-12 MED ORDER — ZOLPIDEM TARTRATE 5 MG PO TABS: 5.0000 mg | ORAL_TABLET | Freq: Every evening | ORAL | Status: DC | PRN

## 2012-10-12 MED ORDER — WITCH HAZEL-GLYCERIN EX PADS: 1.0000 "application " | MEDICATED_PAD | CUTANEOUS | Status: DC | PRN

## 2012-10-12 MED ORDER — OXYCODONE-ACETAMINOPHEN 5-325 MG PO TABS
1.0000 | ORAL_TABLET | ORAL | Status: DC | PRN
  Administered 2012-10-13 – 2012-10-14 (×4): 1 via ORAL
  Filled 2012-10-12 (×4): qty 1

## 2012-10-12 MED ORDER — OXYTOCIN 10 UNIT/ML IJ SOLN
INTRAMUSCULAR | Status: AC
  Filled 2012-10-12: qty 4

## 2012-10-12 MED ORDER — LANOLIN HYDROUS EX OINT: 1.0000 "application " | TOPICAL_OINTMENT | CUTANEOUS | Status: DC | PRN

## 2012-10-12 MED ORDER — MORPHINE SULFATE (PF) 0.5 MG/ML IJ SOLN
INTRAMUSCULAR | Status: DC | PRN
  Administered 2012-10-12: 4 mg via EPIDURAL

## 2012-10-12 MED ORDER — DIBUCAINE 1 % RE OINT: 1.0000 "application " | TOPICAL_OINTMENT | RECTAL | Status: DC | PRN

## 2012-10-12 MED ORDER — ROCURONIUM BROMIDE 50 MG/5ML IV SOLN
INTRAVENOUS | Status: AC
  Filled 2012-10-12: qty 1

## 2012-10-12 MED ORDER — ONDANSETRON HCL 4 MG/2ML IJ SOLN: 4.0000 mg | Freq: Three times a day (TID) | INTRAMUSCULAR | Status: DC | PRN

## 2012-10-12 MED ORDER — OXYTOCIN 40 UNITS IN LACTATED RINGERS INFUSION - SIMPLE MED: 62.5000 mL/h | INTRAVENOUS | Status: AC

## 2012-10-12 MED ORDER — DIPHENHYDRAMINE HCL 25 MG PO CAPS: 25.0000 mg | ORAL_CAPSULE | Freq: Four times a day (QID) | ORAL | Status: DC | PRN

## 2012-10-12 MED ORDER — MEPERIDINE HCL 25 MG/ML IJ SOLN
INTRAMUSCULAR | Status: DC | PRN
  Administered 2012-10-12: 6 mg via INTRAVENOUS
  Administered 2012-10-12: 7 mg via INTRAVENOUS
  Administered 2012-10-12 (×2): 6 mg via INTRAVENOUS

## 2012-10-12 MED ORDER — DIPHENHYDRAMINE HCL 50 MG/ML IJ SOLN: 12.5000 mg | INTRAMUSCULAR | Status: DC | PRN

## 2012-10-12 MED ORDER — KETOROLAC TROMETHAMINE 30 MG/ML IJ SOLN: 30.0000 mg | Freq: Four times a day (QID) | INTRAMUSCULAR | Status: AC | PRN

## 2012-10-12 MED ORDER — MENTHOL 3 MG MT LOZG: 1.0000 | LOZENGE | OROMUCOSAL | Status: DC | PRN

## 2012-10-12 MED ORDER — OXYTOCIN 10 UNIT/ML IJ SOLN
40.0000 [IU] | INTRAVENOUS | Status: DC | PRN
  Administered 2012-10-12: 40 [IU] via INTRAVENOUS

## 2012-10-12 SURGICAL SUPPLY — 34 items
CLAMP CORD UMBIL (MISCELLANEOUS) IMPLANT
CLOTH BEACON ORANGE TIMEOUT ST (SAFETY) ×2 IMPLANT
CONTAINER PREFILL 10% NBF 15ML (MISCELLANEOUS) IMPLANT
DRAPE LG THREE QUARTER DISP (DRAPES) ×2 IMPLANT
DRSG OPSITE 6X11 MED (GAUZE/BANDAGES/DRESSINGS) ×2 IMPLANT
DRSG OPSITE POSTOP 4X10 (GAUZE/BANDAGES/DRESSINGS) ×2 IMPLANT
DURAPREP 26ML APPLICATOR (WOUND CARE) ×2 IMPLANT
ELECT REM PT RETURN 9FT ADLT (ELECTROSURGICAL) ×2
ELECTRODE REM PT RTRN 9FT ADLT (ELECTROSURGICAL) ×1 IMPLANT
EXTRACTOR VACUUM M CUP 4 TUBE (SUCTIONS) IMPLANT
GLOVE ECLIPSE 6.0 STRL STRAW (GLOVE) ×2 IMPLANT
GLOVE ECLIPSE 6.5 STRL STRAW (GLOVE) ×2 IMPLANT
GOWN STRL REIN XL XLG (GOWN DISPOSABLE) ×4 IMPLANT
KIT ABG SYR 3ML LUER SLIP (SYRINGE) IMPLANT
NEEDLE HYPO 25X5/8 SAFETYGLIDE (NEEDLE) IMPLANT
NS IRRIG 1000ML POUR BTL (IV SOLUTION) ×2 IMPLANT
PACK C SECTION WH (CUSTOM PROCEDURE TRAY) ×2 IMPLANT
PAD OB MATERNITY 4.3X12.25 (PERSONAL CARE ITEMS) ×2 IMPLANT
RTRCTR C-SECT PINK 25CM LRG (MISCELLANEOUS) ×2 IMPLANT
STAPLER VISISTAT 35W (STAPLE) ×2 IMPLANT
SUT PLAIN 0 NONE (SUTURE) IMPLANT
SUT VIC AB 0 CT1 27 (SUTURE) ×3
SUT VIC AB 0 CT1 27XBRD ANBCTR (SUTURE) ×3 IMPLANT
SUT VIC AB 1 CTX 36 (SUTURE) ×2
SUT VIC AB 1 CTX36XBRD ANBCTRL (SUTURE) ×2 IMPLANT
SUT VIC AB 3-0 CT1 27 (SUTURE) ×1
SUT VIC AB 3-0 CT1 TAPERPNT 27 (SUTURE) ×1 IMPLANT
SUT VIC AB 3-0 PS2 18 (SUTURE)
SUT VIC AB 3-0 PS2 18XBRD (SUTURE) IMPLANT
SUT VIC AB 3-0 SH 27 (SUTURE)
SUT VIC AB 3-0 SH 27X BRD (SUTURE) IMPLANT
TOWEL OR 17X24 6PK STRL BLUE (TOWEL DISPOSABLE) ×2 IMPLANT
TRAY FOLEY CATH 14FR (SET/KITS/TRAYS/PACK) IMPLANT
WATER STERILE IRR 1000ML POUR (IV SOLUTION) IMPLANT

## 2012-10-12 NOTE — Anesthesia Postprocedure Evaluation (Signed)
  Anesthesia Post-op Note  Anesthesia Post Note  Patient: Samantha Suarez  Procedure(s) Performed: Procedure(s) (LRB): CESAREAN SECTION (N/A)  Anesthesia type: Epidural  Patient location: Mother/Baby  Post pain: Pain level controlled  Post assessment: Post-op Vital signs reviewed  Last Vitals:  Filed Vitals:   10/12/12 0740  BP: 97/71  Pulse: 56  Temp: 36.9 C  Resp: 18    Post vital signs: Reviewed  Level of consciousness:alert  Complications: No apparent anesthesia complications

## 2012-10-12 NOTE — Progress Notes (Signed)
Patient still +1.  Too high for operative delivery in my opinion.  Will proceed with Cesarean.  Patient understands and agrees.

## 2012-10-12 NOTE — Transfer of Care (Signed)
Immediate Anesthesia Transfer of Care Note  Patient: Samantha Suarez  Procedure(s) Performed: Procedure(s): CESAREAN SECTION (N/A)  Patient Location: PACU  Anesthesia Type:Epidural  Level of Consciousness: awake, alert , oriented and patient cooperative  Airway & Oxygen Therapy: Patient Spontanous Breathing  Post-op Assessment: Report given to PACU RN and Post -op Vital signs reviewed and stable  Post vital signs: Reviewed and stable  Complications: No apparent anesthesia complications

## 2012-10-12 NOTE — Op Note (Addendum)
Patient Name: Samantha Suarez MRN: [id number]  Date of Surgery: 10/12/2012    PREOPERATIVE DIAGNOSIS: Failure to Progress  POSTOPERATIVE DIAGNOSIS: Failure to Progress   PROCEDURE: Low transverse cesarean section  SURGEON: Caralyn Guile. Arlyce Dice M.D.  ANESTHESIA: Epidural  ESTIMATED BLOOD LOSS: 800 ml  FINDINGS: Female, Apgar 9,9; BW 7 lbs 6 oz.; Thick meconium fluid, Normal adnexa and uterus.   INDICATIONS: Failed induction for polyhydramnios.  Failure of descent in 2nd stage.  PROCEDURE IN DETAIL: The patient was taken to the operating room and the epidural that had been placed in labor was re injected for surgical anesthesia.  She was then placed in the supine position with left lateral displacement of the uterus. The abdomen was prepped and draped in a sterile fashion and the bladder was catheterized.  A low transverse abdominal incision was made and carried down to the fascia. The fascia was opened transversely and the rectus sheath was dissected from the underlying rectus muscle. The rectus midline was identified and opened by sharp and blunt dissection. The peritoneum was opened. An Alexis retractor was placed and the lower uterine segment was identified, entered transversely by careful sharp dissection, and extended bluntly.  The infant was delivered without difficulty. The placenta was delivered and the uterus was bluntly curettaged. The lower segment was closed with running interlocking Vicryl 1 suture.  A second imbricating Vicryl 1 suture line was placed.  Hemostasis was obtained with vertical mattress sutures. The peritoneum and rectus muscle were closed in the midline with running 3-0 Vicryl suture. The fascia was closed with running 0 Vicryl suture and the skin was closed with staples. All sponge and instrument counts were correct.  The patient tolerated the procedure well and left the operating room in good condition.

## 2012-10-12 NOTE — Anesthesia Postprocedure Evaluation (Signed)
  Anesthesia Post-op Note  Patient: Samantha Suarez  Procedure(s) Performed: Procedure(s): CESAREAN SECTION (N/A)  Patient Location: PACU  Anesthesia Type:Epidural  Level of Consciousness: awake, alert  and oriented  Airway and Oxygen Therapy: Patient Spontanous Breathing  Post-op Pain: mild  Post-op Assessment: Post-op Vital signs reviewed, Patient's Cardiovascular Status Stable, Respiratory Function Stable, Patent Airway, No signs of Nausea or vomiting, Pain level controlled and No headache  Post-op Vital Signs: Reviewed and stable  Complications: No apparent anesthesia complications

## 2012-10-12 NOTE — Progress Notes (Signed)
Patient has been pushing for 2 hours.  At one hour she felt OP and I attempted manual rotation to OA.  On exam now she feels OA but has not descended past +1 station.  The vaginal septum (noted at her initial Ob visit) was grasped with Tresa Endo clamps,cut, and ligated with 2-0 chromic.  Bleeding from the posterior vagina was controlled with figure of 8 3-0 chormic suture.  The FHTs show good variability and no ominous decelerations are present.  I will move to cesarean in 1 hour unless she makes significant progress now that the septum has been lysed.

## 2012-10-12 NOTE — Progress Notes (Signed)
POD#0 Patient is eating and drinking ok. Catheter in place, not yet OOB.  Pain control is good.  Filed Vitals:   10/12/12 0545 10/12/12 0645 10/12/12 0740 10/12/12 0800  BP: 110/59 108/65 97/71   Pulse: 70 62 56   Temp: 98.5 F (36.9 C) 97.3 F (36.3 C) 98.5 F (36.9 C)   TempSrc: Oral Oral Oral   Resp: 20 20 18    Height:      Weight:      SpO2: 98% 97%  98%    [employer]: d/c/i Ext: SCDs in place, no CT  Lab Results  Component Value Date   WBC 22.4* 10/12/2012   HGB 11.6* 10/12/2012   HCT 32.7* 10/12/2012   MCV 89.3 10/12/2012   PLT 181 10/12/2012    --/--/O POS, O POS (08/10 2010)  A/P Post op day 0 - LTCS for FTP  Routine care.   Philip Aspen

## 2012-10-13 ENCOUNTER — Encounter (HOSPITAL_COMMUNITY): Payer: Self-pay | Admitting: Obstetrics & Gynecology

## 2012-10-13 LAB — CBC
MCV: 89.4 fL (ref 78.0–100.0)
Platelets: 170 10*3/uL (ref 150–400)
RBC: 3.29 MIL/uL — ABNORMAL LOW (ref 3.87–5.11)
RDW: 13.5 % (ref 11.5–15.5)
WBC: 22.3 10*3/uL — ABNORMAL HIGH (ref 4.0–10.5)

## 2012-10-13 NOTE — Progress Notes (Signed)
Post Op Day 1.5 Subjective: no complaints  Objective: Blood pressure 120/68, pulse 87, temperature 98.1 F (36.7 C), temperature source Oral, resp. rate 19, height 4\' 9"  (1.448 m), weight 70.761 kg (156 lb), last menstrual period 01/10/2012, SpO2 98.00%,  breastfeeding.  Physical Exam:  General: alert Lochia: appropriate Uterine Fundus: firm Incision: no significant drainage   Recent Labs  10/12/12 0325 10/13/12 0600  HGB 11.6* 10.5*  HCT 32.7* 29.4*    Assessment/Plan: Plan for discharge tomorrow if appropriate.  OK to remove staples on POD 2 if patient ready to go home.   LOS: 3 days   Mickel Baas 10/13/2012, 9:51 AM

## 2012-10-14 MED ORDER — OXYCODONE-ACETAMINOPHEN 5-325 MG PO TABS: 1.0000 | ORAL_TABLET | ORAL | Status: DC | PRN

## 2012-10-14 NOTE — Discharge Summary (Signed)
Obstetric Discharge Summary Reason for Admission: induction of labor Prenatal Procedures: ultrasound Intrapartum Procedures: cesarean: low cervical, transverse Postpartum Procedures: none Complications-Operative and Postpartum: none Hemoglobin  Date Value Range Status  10/13/2012 10.5* 12.0 - 15.0 g/dL Final     HCT  Date Value Range Status  10/13/2012 29.4* 36.0 - 46.0 % Final    Discharge Diagnoses: Term Pregnancy-delivered  Discharge Information: Date: 10/14/2012 Activity: pelvic rest Diet: routine Medications: Iron and Percocet Condition: stable Instructions: refer to practice specific booklet Discharge to: home Follow-up Information   Follow up with Mickel Baas, MD In 4 weeks.   Specialty:  Obstetrics and Gynecology   Contact information:   [address] [place] [address] [phone]       Newborn Data: Live born female  Birth Weight: 7 lb 6.5 oz (3360 g) APGAR: 9, 9  Home with mother.  Samantha Suarez A 10/14/2012, 7:11 AM

## 2012-10-14 NOTE — Lactation Note (Signed)
This note was copied from the chart of Samantha Suarez. Lactation Consultation Note  Patient Name: Samantha Suarez [id number] Date: 10/14/2012 Reason for consult: Follow-up assessment Mother is a 2 day c/s that is going home today. She staets baby is breastfeeding wee, denies any discomfort and reports breast are filling. Mother is changing the baby's diaper. She requested a hand pump to start pumping for Dad to be able to feed the baby too. Advised to breastfeed exclusively for 3-4 weeks to establish a good milk supply and ease of feeding before introducing an artificial teat. Discuss the use and cleaning of the manual pump. Mother informed of outpatient lactation services and support group.  Maternal Data    Feeding Feeding Type: Breast Milk Length of feed: 15 min  LATCH Score/Interventions Latch: Grasps breast easily, tongue down, lips flanged, rhythmical sucking.  Audible Swallowing: Spontaneous and intermittent  Type of Nipple: Everted at rest and after stimulation  Comfort (Breast/Nipple): Soft / non-tender     Hold (Positioning): Assistance needed to correctly position infant at breast and maintain latch.  LATCH Score: 9  Lactation Tools Discussed/Used     Consult Status Consult Status: Complete    Omar Person 10/14/2012, 10:50 AM

## 2012-10-14 NOTE — Progress Notes (Signed)
  Patient is eating, ambulating, voiding.  Pain control is good.  Filed Vitals:   10/13/12 0130 10/13/12 0639 10/13/12 1805 10/14/12 0600  BP: 118/76 120/68 123/73 111/73  Pulse: 88 87 84 82  Temp: 97.6 F (36.4 C) 98.1 F (36.7 C) 98 F (36.7 C) 97.8 F (36.6 C)  TempSrc: Oral Oral Oral   Resp: 18 19 20 18   Height:      Weight:      SpO2: 99% 98%      lungs:   clear to auscultation cor:    RRR Abdomen:  soft, appropriate tenderness, incisions intact and without erythema or exudate ex:    no cords   Lab Results  Component Value Date   WBC 22.3* 10/13/2012   HGB 10.5* 10/13/2012   HCT 29.4* 10/13/2012   MCV 89.4 10/13/2012   PLT 170 10/13/2012    --/--/O POS, O POS (08/10 2010)/RI  A/P    Post operative day 2.  Routine post op and postpartum care.  Expect d/c today.  Percocet for pain control.

## 2013-05-13 DIAGNOSIS — R87619 Unspecified abnormal cytological findings in specimens from cervix uteri: Secondary | ICD-10-CM

## 2013-05-13 HISTORY — DX: Unspecified abnormal cytological findings in specimens from cervix uteri: R87.619

## 2013-07-08 ENCOUNTER — Encounter (HOSPITAL_COMMUNITY): Payer: Self-pay

## 2013-07-19 ENCOUNTER — Ambulatory Visit (HOSPITAL_COMMUNITY)
Admission: RE | Admit: 2013-07-19 | Discharge: 2013-07-19 | Disposition: A | Discharge status: Home / Self Care | Payer: BC Managed Care – PPO | Source: Ambulatory Visit | Attending: Obstetrics and Gynecology | Admitting: Obstetrics and Gynecology

## 2013-07-19 ENCOUNTER — Encounter (INDEPENDENT_AMBULATORY_CARE_PROVIDER_SITE_OTHER): Payer: Self-pay

## 2013-07-19 ENCOUNTER — Encounter (HOSPITAL_COMMUNITY): Payer: Self-pay

## 2013-07-19 ENCOUNTER — Encounter (HOSPITAL_COMMUNITY): Payer: Self-pay | Admitting: *Deleted

## 2013-07-19 VITALS — BP 104/64 | Temp 98.1°F | Ht 60.0 in | Wt 122.0 lb

## 2013-07-19 DIAGNOSIS — R87810 Cervical high risk human papillomavirus (HPV) DNA test positive: Secondary | ICD-10-CM

## 2013-07-19 DIAGNOSIS — R87610 Atypical squamous cells of undetermined significance on cytologic smear of cervix (ASC-US): Secondary | ICD-10-CM | POA: Insufficient documentation

## 2013-07-19 DIAGNOSIS — Z1239 Encounter for other screening for malignant neoplasm of breast: Secondary | ICD-10-CM

## 2013-07-19 HISTORY — DX: Unspecified abnormal cytological findings in specimens from cervix uteri: R87.619

## 2013-07-19 NOTE — Patient Instructions (Addendum)
Taught Samantha Suarez how to perform BSE and gave educational materials to take home. Patient did not need a Pap smear today due to last Pap smear was 05/13/2013. Patient [facility] for colposcopy to follow up for abnormal Pap smear. Appointment scheduled for Wednesday, Jul 20, 2013 at 1415. Patient aware of appointment and will be there. Informed patient she will need a screening mammogram at age 28 unless clinically indicated prior. Samantha Suarez verbalized understanding.  Saintclair Halstedhristine Poll Keyston Ardolino, RN 12:56 PM

## 2013-07-19 NOTE — Progress Notes (Addendum)
[facility] due to recommending a colposcopy due to last Pap smear on 05/13/2013 was abnormal.  Pap Smear:  Pap smear not completed today. [facility] and ASCUS HPV+. Patient referred to the [facility] for colposcopy. Appointment scheduled for Wednesday, Jul 20, 2013 at 1415. Per patient has a history of an abnormal Pap smear around September 2013 that required a colposcopy for follow-up. Pap smear result is scanned in EPIC under media.  Physical exam: Breasts Breasts symmetrical. No skin abnormalities bilateral breasts. No nipple retraction bilateral breasts. No nipple discharge bilateral breasts. No lymphadenopathy. No lumps palpated bilateral breasts. No complaints of pain or tenderness on exam.  Screening mammogram recommended at age 28 unless clinically indicated prior.      Pelvic/Bimanual No Pap smear completed today since last Pap smear was 05/13/2013. Pap smear not indicated per BCCCP guidelines.

## 2013-07-20 ENCOUNTER — Other Ambulatory Visit (HOSPITAL_COMMUNITY)
Admission: RE | Admit: 2013-07-20 | Discharge: 2013-07-20 | Disposition: A | Discharge status: Home / Self Care | Payer: BC Managed Care – PPO | Source: Ambulatory Visit | Attending: Obstetrics & Gynecology | Admitting: Obstetrics & Gynecology

## 2013-07-20 ENCOUNTER — Encounter: Payer: Self-pay | Admitting: Obstetrics & Gynecology

## 2013-07-20 ENCOUNTER — Ambulatory Visit (INDEPENDENT_AMBULATORY_CARE_PROVIDER_SITE_OTHER): Payer: BC Managed Care – PPO | Admitting: Obstetrics & Gynecology

## 2013-07-20 VITALS — BP 103/63 | HR 70 | Temp 97.6°F | Ht 60.0 in | Wt 122.0 lb

## 2013-07-20 DIAGNOSIS — N870 Mild cervical dysplasia: Secondary | ICD-10-CM | POA: Insufficient documentation

## 2013-07-20 DIAGNOSIS — R87610 Atypical squamous cells of undetermined significance on cytologic smear of cervix (ASC-US): Secondary | ICD-10-CM

## 2013-07-20 DIAGNOSIS — R87810 Cervical high risk human papillomavirus (HPV) DNA test positive: Secondary | ICD-10-CM

## 2013-07-20 DIAGNOSIS — Z01812 Encounter for preprocedural laboratory examination: Secondary | ICD-10-CM

## 2013-07-20 LAB — POCT PREGNANCY, URINE: PREG TEST UR: NEGATIVE

## 2013-08-15 ENCOUNTER — Encounter: Payer: Self-pay | Admitting: Obstetrics & Gynecology

## 2013-08-15 ENCOUNTER — Ambulatory Visit (INDEPENDENT_AMBULATORY_CARE_PROVIDER_SITE_OTHER): Payer: BC Managed Care – PPO | Admitting: Obstetrics & Gynecology

## 2013-08-15 VITALS — BP 105/71 | HR 70 | Ht <= 58 in | Wt 121.6 lb

## 2013-08-15 DIAGNOSIS — N870 Mild cervical dysplasia: Secondary | ICD-10-CM

## 2013-08-15 NOTE — Progress Notes (Signed)
   Subjective:    Patient ID: Samantha Suarez, female    DOB: 07-Nov-1985, 28 y.o.   MRN: [id number]  HPI  She is here for results.  Review of Systems     Objective:   Physical Exam        Assessment & Plan:  LGSIL pap and cervical biopsy with negative ECC Pap and cotesting in a year.

## 2013-10-25 ENCOUNTER — Encounter: Payer: Self-pay | Admitting: General Practice

## 2014-01-02 ENCOUNTER — Encounter: Payer: Self-pay | Admitting: Obstetrics & Gynecology

## 2014-01-11 ENCOUNTER — Ambulatory Visit (INDEPENDENT_AMBULATORY_CARE_PROVIDER_SITE_OTHER): Payer: BC Managed Care – PPO | Admitting: Physician Assistant

## 2014-01-11 VITALS — BP 120/76 | HR 68 | Temp 98.0°F | Resp 16 | Ht 60.0 in | Wt 122.0 lb

## 2014-01-11 DIAGNOSIS — R300 Dysuria: Secondary | ICD-10-CM

## 2014-01-11 LAB — POCT URINALYSIS DIPSTICK
BILIRUBIN UA: NEGATIVE
Glucose, UA: NEGATIVE
KETONES UA: NEGATIVE
NITRITE UA: NEGATIVE
PH UA: 6.5
Protein, UA: 100
SPEC GRAV UA: 1.015
Urobilinogen, UA: 0.2

## 2014-01-11 LAB — POCT UA - MICROSCOPIC ONLY
CASTS, UR, LPF, POC: NEGATIVE
CRYSTALS, UR, HPF, POC: NEGATIVE
MUCUS UA: NEGATIVE
Yeast, UA: NEGATIVE

## 2014-01-11 MED ORDER — NITROFURANTOIN MONOHYD MACRO 100 MG PO CAPS: 100.0000 mg | ORAL_CAPSULE | Freq: Two times a day (BID) | ORAL | Status: DC

## 2014-01-11 NOTE — Patient Instructions (Signed)
1. Complete antibiotic and continue to drink plenty of water. 2. Due to increased amount of blood in urine would like you to return to clinic in 2 weeks 11/23-29/15 for a recheck.

## 2014-01-11 NOTE — Progress Notes (Signed)
Subjective:    Patient ID: Samantha Suarez, female    DOB: 02/01/1986, 28 y.o.   MRN: [id number]  HPI Patient presents with 4 days discomforts with urination. Discomfort has progressed to burning with urination, especially with first am urination. Endorses urge, incomplete emptying, lower abdominal crampy pain, and pressure in bladder. Had chills this morning only. Denies frequency, back/flank pain, hematuria, vaginal discharge, or new sexual partner. Uses condoms with intercourse; was last 5 days ago.   Review of Systems  Constitutional: Positive for chills. Negative for fever, diaphoresis and fatigue.  Gastrointestinal: Positive for abdominal pain (lower). Negative for nausea, vomiting, diarrhea and constipation.  Genitourinary: Positive for dysuria, urgency and decreased urine volume. Negative for frequency, hematuria, flank pain, vaginal bleeding, vaginal discharge, enuresis, difficulty urinating, vaginal pain and dyspareunia.  Musculoskeletal: Negative for myalgias and back pain.  Allergic/Immunologic: Negative for environmental allergies and food allergies.  Neurological: Negative for dizziness and headaches.       Objective:   Physical Exam  Constitutional: She is oriented to person, place, and time. She appears well-developed and well-nourished. No distress.  Blood pressure 120/76, pulse 68, temperature 98 F (36.7 C), temperature source Oral, resp. rate 16, height 5' (1.524 m), weight 122 lb (55.339 kg), last menstrual period 12/16/2013, SpO2 100 %, not currently breastfeeding.   HENT:  Head: Normocephalic and atraumatic.  Right Ear: External ear normal.  Left Ear: External ear normal.  Eyes: Right eye exhibits no discharge. Left eye exhibits no discharge.  Cardiovascular: Normal rate, regular rhythm and normal heart sounds.  Exam reveals no gallop and no friction rub.   No murmur heard. Pulmonary/Chest: Effort normal and breath sounds normal. She has no wheezes. She has no  rales.  Abdominal: Soft. Bowel sounds are normal. She exhibits no distension and no mass. There is tenderness in the right lower quadrant and left lower quadrant. There is no rebound, no guarding, no CVA tenderness, no tenderness at McBurney's point and negative Murphy's sign. No hernia.  Neurological: She is alert and oriented to person, place, and time.  Skin: Skin is warm and dry. No rash noted. She is not diaphoretic. No erythema. No pallor.   Results for orders placed or performed in visit on 01/11/14  POCT urinalysis dipstick  Result Value Ref Range   Color, UA yellow    Clarity, UA cloudy    Glucose, UA neg    Bilirubin, UA neg    Ketones, UA neg    Spec Grav, UA 1.015    Blood, UA large    pH, UA 6.5    Protein, UA 100    Urobilinogen, UA 0.2    Nitrite, UA neg    Leukocytes, UA Trace   POCT UA - Microscopic Only  Result Value Ref Range   WBC, Ur, HPF, POC 1-4    RBC, urine, microscopic TNTC    Bacteria, U Microscopic trace    Mucus, UA neg    Epithelial cells, urine per micros 0-3    Crystals, Ur, HPF, POC neg    Casts, Ur, LPF, POC neg    Yeast, UA neg        Assessment & Plan:  1. Dysuria With increased amount of blood in urine and only trace amounts of bacteria and no nitrites, would like to recheck urine to confirm that blood has cleared up in 2 weeks. If has not cleared, would like to refer to urology for evaluation and/or imaging for possible kidney stones.  -  POCT urinalysis dipstick - POCT UA - Microscopic Only - Urine culture - nitrofurantoin, macrocrystal-monohydrate, (MACROBID) 100 MG capsule; Take 1 capsule (100 mg total) by mouth 2 (two) times daily.  Dispense: 20 capsule; Refill: 0    Rayla Pember PA-C  [facility] 01/11/2014 12:49 PM

## 2014-01-12 LAB — URINE CULTURE
Colony Count: NO GROWTH
Organism ID, Bacteria: NO GROWTH

## 2014-06-13 ENCOUNTER — Telehealth (HOSPITAL_COMMUNITY): Payer: Self-pay | Admitting: *Deleted

## 2014-06-13 NOTE — Telephone Encounter (Signed)
Telephoned patient at home # and advised was time for pap smear. Patient states now has insurance through her job. Patient is no longer eligible for [facility]. Patient states received bill for colpo. Advised would need to bring copy of bill to [employer] Corporation[employer]ine Suarez. Patient voiced understanding.

## 2015-04-20 IMAGING — US US FETAL BPP W/O NONSTRESS
1 series · 13 of 17 positions shown · non-contrast
Comparison: none

[Series 1: us fetal bpp w/o nonstress · 0.23mm/px · 13 of 17 slices shown]
[im 1/17]
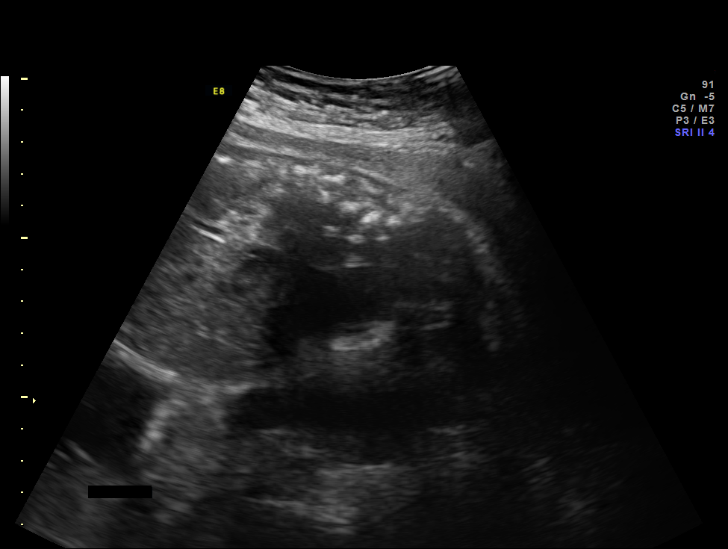
[im 2/17]
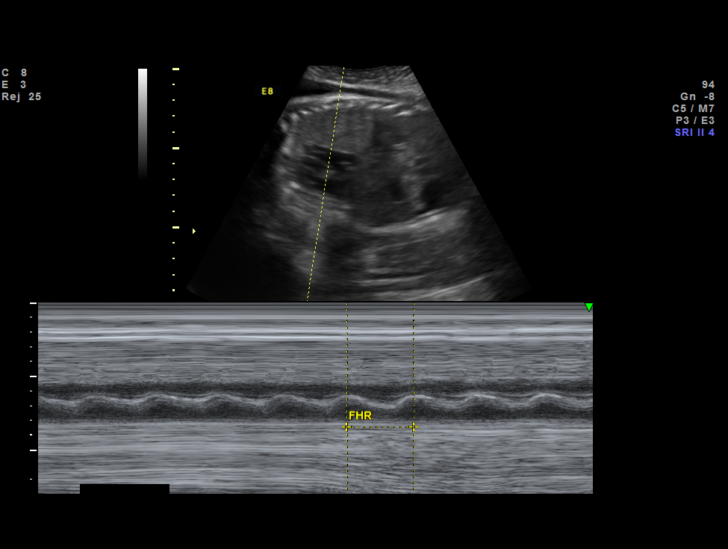
[im 4/17]
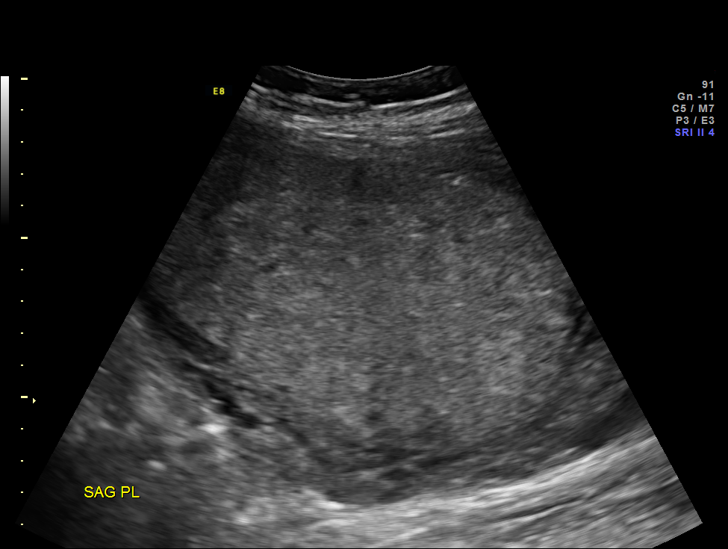
[im 5/17]
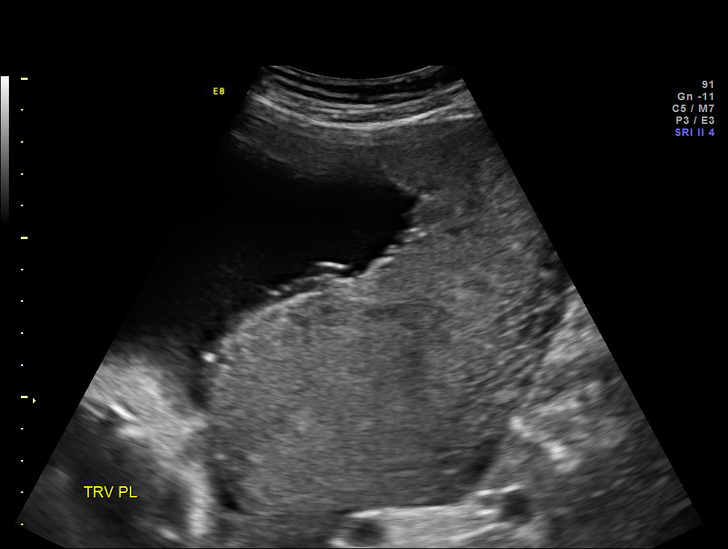
[im 6/17]
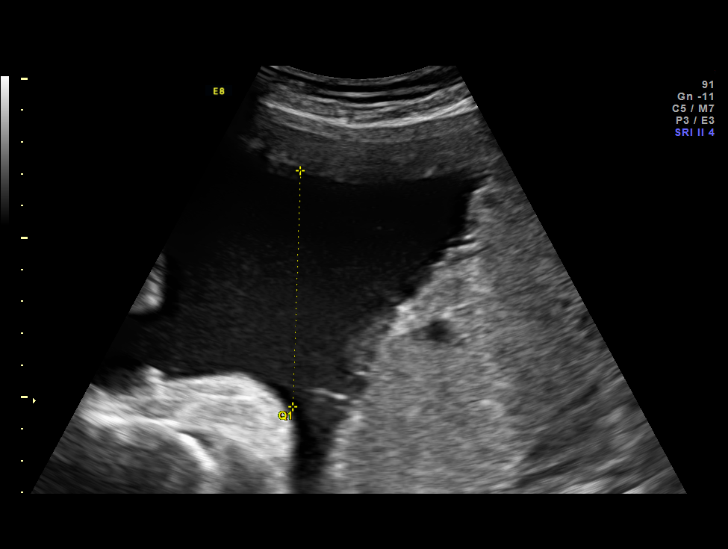
[im 8/17]
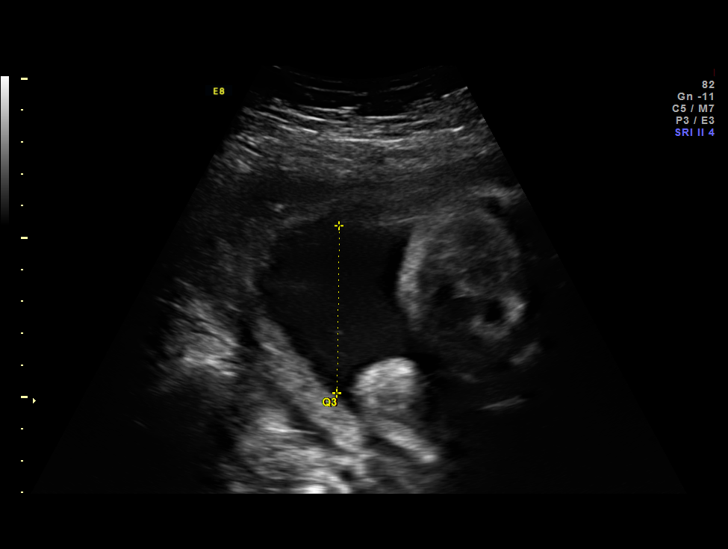
[im 9/17]
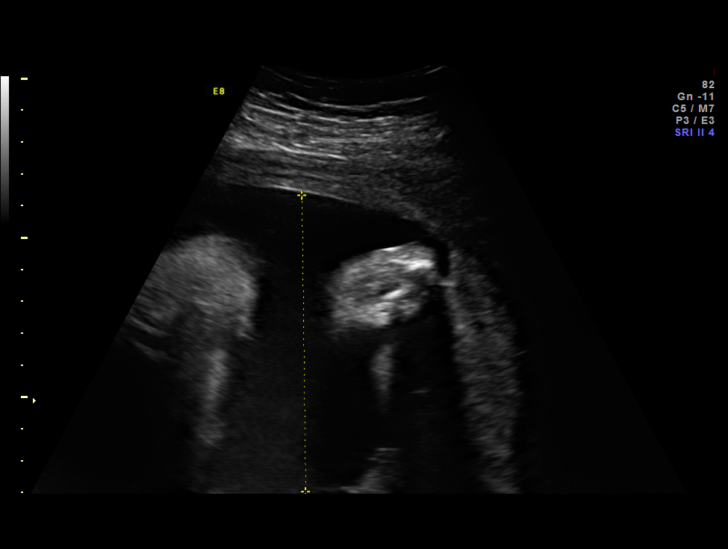
[im 10/17]
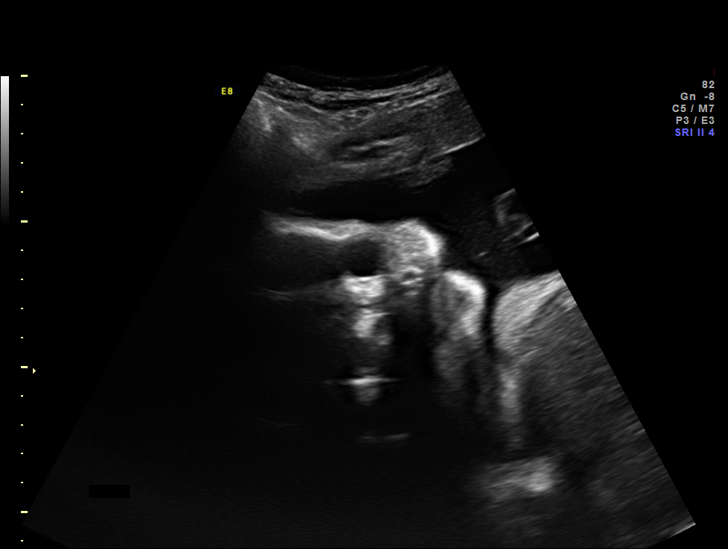
[im 12/17]
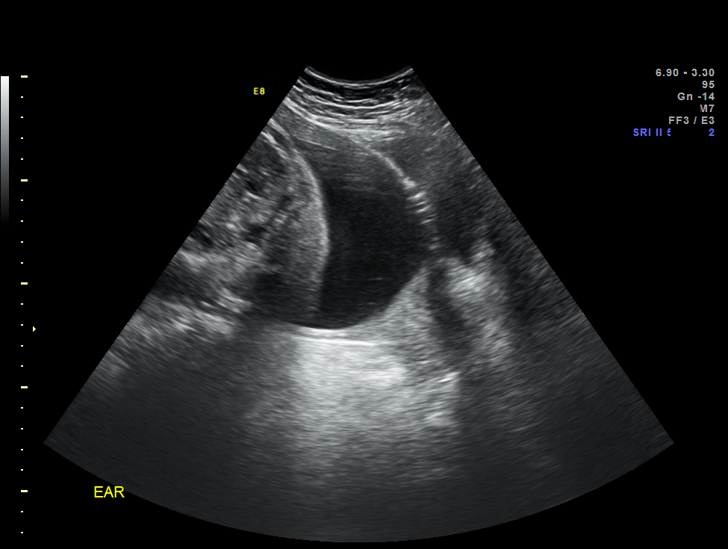
[im 13/17]
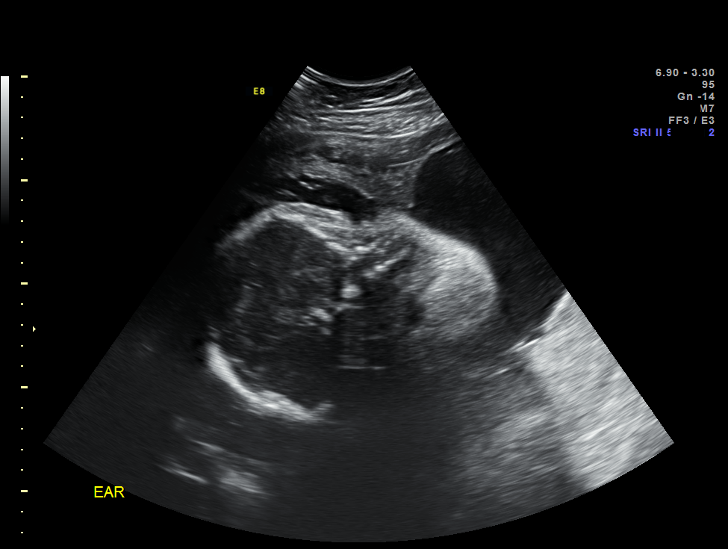
[im 14/17]
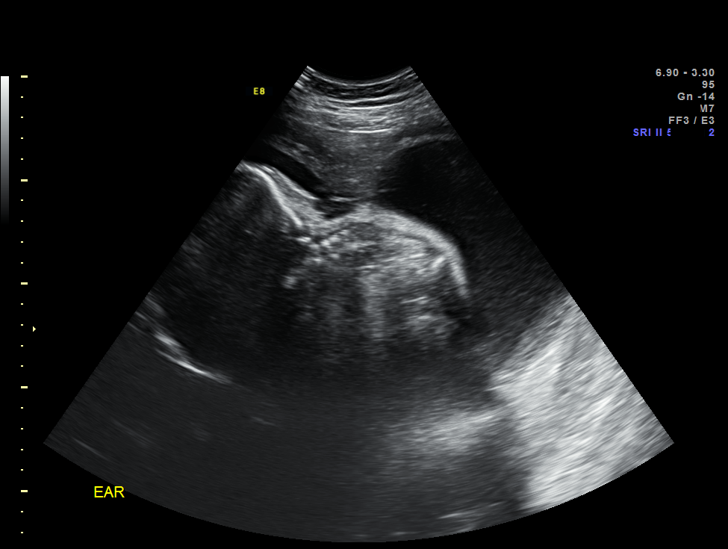
[im 16/17]
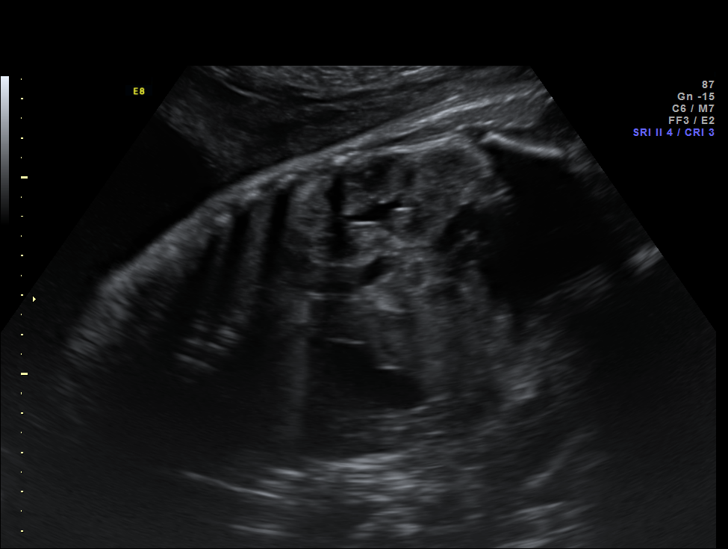
[im 17/17]
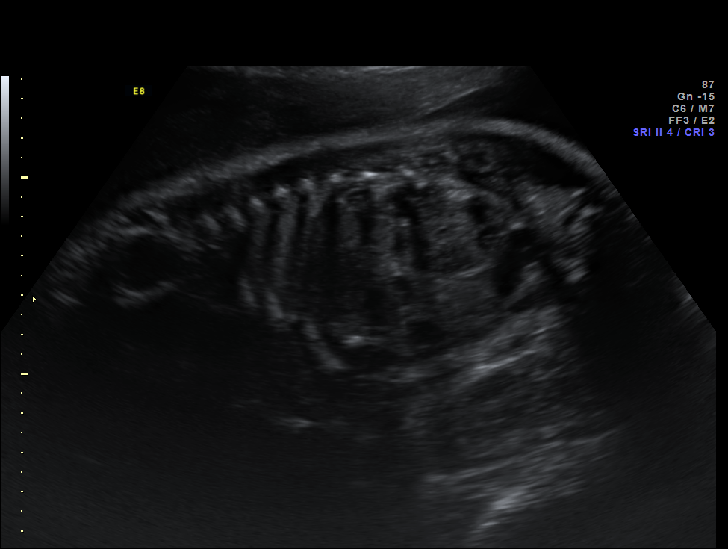

[13 of 17 positions shown; findings below may reference images not displayed]

OBSTETRICS REPORT
                      (Signed Final 08/19/2012 [DATE])

Service(s) Provided

Indications

 Polyhydramnios
Fetal Evaluation

 Num Of Fetuses:    1
 Fetal Heart Rate:  150                          bpm
 Cardiac Activity:  Observed
 Presentation:      Frank breech
 Placenta:          Posterior, above cervical
                    os
 P. Cord            Not well visualized
 Insertion:

 Amniotic Fluid
 AFI FV:      Polyhydramnios
 AFI Sum:     27.37   cm     > 97  %Tile     Larg Pckt:    9.31  cm
 RUQ:   7.42    cm   RLQ:    9.31   cm    LUQ:   5.38    cm   LLQ:    5.26   cm
Biophysical Evaluation

 Amniotic F.V:   Within normal limits       F. Tone:        Observed
 F. Movement:    Observed                   Score:          [DATE]
 F. Breathing:   Observed
Gestational Age

 LMP:           31w 5d        Date:  01/10/12                 EDD:   10/16/12
 Best:          31w 5d     Det. By:  LMP  (01/10/12)          EDD:   10/16/12
Cervix Uterus Adnexa

 Cervix:       Not visualized (advanced GA >91wks)
Impression

 IUP at 31+5 weeks
 Mild - moderate polyhydramnios; normal stomach
 BPP [DATE]
 Normal cervical length - TA views
Recommendations

 Continue antenatal testing
 Growth in 2 weeks
 (the above BPPs and Rudi are scheduled here but feel free to
 perform these in your office)

 questions or concerns.

## 2015-04-30 LAB — OB RESULTS CONSOLE RUBELLA ANTIBODY, IGM: Rubella: NON-IMMUNE/NOT IMMUNE

## 2015-04-30 LAB — OB RESULTS CONSOLE HEPATITIS B SURFACE ANTIGEN: HEP B S AG: NEGATIVE

## 2015-04-30 LAB — OB RESULTS CONSOLE PLATELET COUNT: PLATELETS: 255 10*3/uL

## 2015-04-30 LAB — OB RESULTS CONSOLE HGB/HCT, BLOOD
HCT: 37 %
Hemoglobin: 12.6 g/dL

## 2015-04-30 LAB — OB RESULTS CONSOLE VARICELLA ZOSTER ANTIBODY, IGG: VARICELLA IGG: IMMUNE

## 2015-04-30 LAB — OB RESULTS CONSOLE ABO/RH: RH Type: POSITIVE

## 2015-04-30 LAB — CYTOLOGY - PAP: Pap: NEGATIVE

## 2015-04-30 LAB — OB RESULTS CONSOLE ANTIBODY SCREEN: ANTIBODY SCREEN: NEGATIVE

## 2015-04-30 LAB — OB RESULTS CONSOLE HIV ANTIBODY (ROUTINE TESTING): HIV: NONREACTIVE

## 2015-04-30 LAB — OB RESULTS CONSOLE GC/CHLAMYDIA: Gonorrhea: NEGATIVE

## 2015-07-27 LAB — OB RESULTS CONSOLE RPR: RPR: NONREACTIVE

## 2015-07-27 LAB — OB RESULTS CONSOLE HGB/HCT, BLOOD
HEMATOCRIT: 37 %
Hemoglobin: 12 g/dL

## 2015-08-03 ENCOUNTER — Encounter: Payer: Self-pay | Admitting: *Deleted

## 2015-08-03 DIAGNOSIS — O0990 Supervision of high risk pregnancy, unspecified, unspecified trimester: Secondary | ICD-10-CM

## 2015-08-03 DIAGNOSIS — O24419 Gestational diabetes mellitus in pregnancy, unspecified control: Secondary | ICD-10-CM

## 2015-08-06 ENCOUNTER — Ambulatory Visit: Payer: Medicaid Other | Admitting: *Deleted

## 2015-08-06 ENCOUNTER — Encounter: Payer: BLUE CROSS/BLUE SHIELD | Attending: Family Medicine | Admitting: Dietician

## 2015-08-06 DIAGNOSIS — Z029 Encounter for administrative examinations, unspecified: Secondary | ICD-10-CM | POA: Insufficient documentation

## 2015-08-06 DIAGNOSIS — O24419 Gestational diabetes mellitus in pregnancy, unspecified control: Secondary | ICD-10-CM

## 2015-08-06 MED ORDER — ACCU-CHEK AVIVA PLUS W/DEVICE KIT: 1.0000 | PACK | Freq: Once | Status: DC

## 2015-08-06 MED ORDER — GLUCOSE BLOOD VI STRP: ORAL_STRIP | Status: DC

## 2015-08-06 MED ORDER — ACCU-CHEK SOFT TOUCH LANCETS MISC: Status: DC

## 2015-08-06 NOTE — Progress Notes (Signed)
Diabetes Education:08/06/15 Second pregnancy.  Daughter is 30 years old.   Had no issues with GDM with first pregnancy.  Denies a family HX of DM. Review of GDM and its implications for the mother and baby. Review of the blood glucose procedure and the schedule for monitoring . Review of the blood glucose goals for fasting and 2 hr post-meals. She has Medicaid and [facility] [address] For the Accu-Chek Aviva Plus meter, a box of strips and a box of lancets. Semi- demonstration of glucose monitoring.  On my checking her glucose at 3 hours since eating; her glucose was 77 mg/dl.  Review of hypoglycemia and the interventions for treating. Completed review of the post-delivery self-care measures for preventing developing type 2 DM later in life. Brief review of the carbohydrate counting, use of food label and carb recommendations for meals. Will need to see the RD for dietary instructions. Maggie Jona Erkkila, RN, RD, LDN

## 2015-08-07 ENCOUNTER — Other Ambulatory Visit: Payer: Self-pay

## 2015-08-07 ENCOUNTER — Encounter: Payer: Self-pay | Admitting: Family

## 2015-08-07 ENCOUNTER — Ambulatory Visit (INDEPENDENT_AMBULATORY_CARE_PROVIDER_SITE_OTHER): Payer: BLUE CROSS/BLUE SHIELD | Admitting: Family

## 2015-08-07 ENCOUNTER — Encounter (HOSPITAL_COMMUNITY): Payer: Self-pay | Admitting: Family

## 2015-08-07 VITALS — BP 97/57 | HR 72 | Wt 151.6 lb

## 2015-08-07 DIAGNOSIS — O34219 Maternal care for unspecified type scar from previous cesarean delivery: Secondary | ICD-10-CM | POA: Insufficient documentation

## 2015-08-07 DIAGNOSIS — O0993 Supervision of high risk pregnancy, unspecified, third trimester: Secondary | ICD-10-CM

## 2015-08-07 DIAGNOSIS — O24419 Gestational diabetes mellitus in pregnancy, unspecified control: Secondary | ICD-10-CM

## 2015-08-07 DIAGNOSIS — O0992 Supervision of high risk pregnancy, unspecified, second trimester: Secondary | ICD-10-CM

## 2015-08-07 LAB — POCT URINALYSIS DIP (DEVICE)
Glucose, UA: NEGATIVE mg/dL
KETONES UR: NEGATIVE mg/dL
Nitrite: NEGATIVE
PH: 6 (ref 5.0–8.0)
Protein, ur: 30 mg/dL — AB
Specific Gravity, Urine: 1.02 (ref 1.005–1.030)
Urobilinogen, UA: 0.2 mg/dL (ref 0.0–1.0)

## 2015-08-07 NOTE — Progress Notes (Signed)
Subjective:  Samantha DroneFabiola Mizuno is a 30 y.o. G3P1011 at 7241w4d being seen today for initial prenatal care appointment.  [facility]  Pregnancy is also complicated by prior csection.  Undecided regarding plans for this pregnancy.  She is currently monitored for the following issues for this high-risk pregnancy and has Atypical squamous cell changes of undetermined significance (ASCUS) on cervical cytology with positive high risk human papilloma virus (HPV); Supervision of high risk pregnancy, antepartum; Gestational diabetes mellitus (GDM), antepartum; and Previous cesarean delivery affecting pregnancy, antepartum on her problem list.  Patient reports no complaints.   .  .  Movement: Present. Denies leaking of fluid.   The following portions of the patient's history were reviewed and updated as appropriate: allergies, current medications, past family history, past medical history, past social history, past surgical history and problem list. Problem list updated.  Objective:   Filed Vitals:   08/07/15 0800  BP: 97/57  Pulse: 72  Weight: 151 lb 9.6 oz (68.765 kg)    Fetal Status: Fetal Heart Rate (bpm): 144 Fundal Height: 32 cm Movement: Present     General:  Alert, oriented and cooperative. Patient is in no acute distress.  Skin: Skin is warm and dry. No rash noted.   Cardiovascular: Normal heart rate noted  Respiratory: Normal respiratory effort, no problems with respiration noted  Abdomen: Soft, gravid, appropriate for gestational age. Pain/Pressure: Absent     Pelvic:       Cervical exam deferred        Extremities: Normal range of motion.  Edema: None  Mental Status: Normal mood and affect. Normal behavior. Normal judgment and thought content.   Urinalysis: Urine Protein: 1+ Urine Glucose: Negative  Assessment and Plan:  Pregnancy: G3P1011 at 6241w4d  1. Previous cesarean delivery affecting pregnancy, antepartum - Given consent for  review  2. Supervision of high risk pregnancy, antepartum, third trimester - Continue close observation  3. Gestational diabetes mellitus (GDM), antepartum, gestational diabetes method of control unspecified - Seen by diabetic educator 08/06/15 - Discussed implications of DM in pregnancy, need for optimizing glycemic control to decrease DM associated maternal-fetal morbidity and mortality, need for antenatal testing and frequent ultrasounds/prenatal visits.  - Anatomy ultrasound scheduled  Preterm labor symptoms and general obstetric precautions including but not limited to vaginal bleeding, contractions, leaking of fluid and fetal movement were reviewed in detail with the patient. Please refer to After Visit Summary for other counseling recommendations.  Return in about 1 week (around 08/14/2015) for CBG check; 2 wks for appt.   Eino FarberWalidah Kennith GainN Karim, CNM

## 2015-08-16 ENCOUNTER — Ambulatory Visit: Payer: BLUE CROSS/BLUE SHIELD | Admitting: General Practice

## 2015-08-16 VITALS — BP 101/56 | HR 108 | Ht <= 58 in | Wt 151.0 lb

## 2015-08-16 DIAGNOSIS — Z719 Counseling, unspecified: Secondary | ICD-10-CM

## 2015-08-16 NOTE — Progress Notes (Signed)
Patient here for blood sugar review today. Blood sugars are as follows:  6/10 89 91 89 6/11 79 6/12 75 84 94 6/13 78 93 119 93  6/14 85 87 105 104  6/15 85 73  Blood sugars reviewed with Dr Shawnie PonsPratt. Dr Shawnie PonsPratt fine with blood sugars- patient will follow up next week. Informed patient

## 2015-08-17 ENCOUNTER — Ambulatory Visit (HOSPITAL_COMMUNITY)
Admission: RE | Admit: 2015-08-17 | Discharge: 2015-08-17 | Disposition: A | Discharge status: Home / Self Care | Payer: BLUE CROSS/BLUE SHIELD | Source: Ambulatory Visit | Attending: Family | Admitting: Family

## 2015-08-17 ENCOUNTER — Encounter (HOSPITAL_COMMUNITY): Payer: Self-pay

## 2015-08-17 VITALS — BP 101/60 | HR 70 | Wt 152.8 lb

## 2015-08-17 DIAGNOSIS — O0993 Supervision of high risk pregnancy, unspecified, third trimester: Secondary | ICD-10-CM

## 2015-08-17 DIAGNOSIS — Z3A31 31 weeks gestation of pregnancy: Secondary | ICD-10-CM | POA: Diagnosis not present

## 2015-08-17 DIAGNOSIS — O24410 Gestational diabetes mellitus in pregnancy, diet controlled: Secondary | ICD-10-CM | POA: Diagnosis not present

## 2015-08-17 DIAGNOSIS — O34219 Maternal care for unspecified type scar from previous cesarean delivery: Secondary | ICD-10-CM | POA: Diagnosis present

## 2015-08-17 HISTORY — DX: Gestational diabetes mellitus in pregnancy, unspecified control: O24.419

## 2015-08-21 ENCOUNTER — Ambulatory Visit (INDEPENDENT_AMBULATORY_CARE_PROVIDER_SITE_OTHER): Payer: BLUE CROSS/BLUE SHIELD | Admitting: Advanced Practice Midwife

## 2015-08-21 VITALS — BP 106/56 | HR 69 | Temp 98.0°F | Wt 151.4 lb

## 2015-08-21 DIAGNOSIS — O34219 Maternal care for unspecified type scar from previous cesarean delivery: Secondary | ICD-10-CM

## 2015-08-21 DIAGNOSIS — R87610 Atypical squamous cells of undetermined significance on cytologic smear of cervix (ASC-US): Secondary | ICD-10-CM | POA: Diagnosis not present

## 2015-08-21 DIAGNOSIS — R87810 Cervical high risk human papillomavirus (HPV) DNA test positive: Secondary | ICD-10-CM

## 2015-08-21 DIAGNOSIS — O24419 Gestational diabetes mellitus in pregnancy, unspecified control: Secondary | ICD-10-CM

## 2015-08-21 LAB — POCT URINALYSIS DIP (DEVICE)
Bilirubin Urine: NEGATIVE
Glucose, UA: NEGATIVE mg/dL
Ketones, ur: 15 mg/dL — AB
NITRITE: NEGATIVE
PH: 5.5 (ref 5.0–8.0)
PROTEIN: NEGATIVE mg/dL
Specific Gravity, Urine: 1.01 (ref 1.005–1.030)
UROBILINOGEN UA: 0.2 mg/dL (ref 0.0–1.0)

## 2015-08-21 NOTE — Progress Notes (Signed)
Called health department to get pap results from February.

## 2015-08-21 NOTE — Patient Instructions (Signed)
Diabetes Mellitus and Food It is important for you to manage your blood sugar (glucose) level. Your blood glucose level can be greatly affected by what you eat. Eating healthier foods in the appropriate amounts throughout the day at about the same time each day will help you control your blood glucose level. It can also help slow or prevent worsening of your diabetes mellitus. Healthy eating may even help you improve the level of your blood pressure and reach or maintain a healthy weight.  General recommendations for healthful eating and cooking habits include:  Eating meals and snacks regularly. Avoid going long periods of time without eating to lose weight.  Eating a diet that consists mainly of plant-based foods, such as fruits, vegetables, nuts, legumes, and whole grains.  Using low-heat cooking methods, such as baking, instead of high-heat cooking methods, such as deep frying. Work with your dietitian to make sure you understand how to use the Nutrition Facts information on food labels. HOW CAN FOOD AFFECT ME? Carbohydrates Carbohydrates affect your blood glucose level more than any other type of food. Your dietitian will help you determine how many carbohydrates to eat at each meal and teach you how to count carbohydrates. Counting carbohydrates is important to keep your blood glucose at a healthy level, especially if you are using insulin or taking certain medicines for diabetes mellitus.  Keep yourself hydrated. Have water, diet soda, or unsweetened iced tea.  Regular soda, juice, and other mixers might contain a lot of carbohydrates and should be counted. WHAT FOODS ARE NOT RECOMMENDED? As you make food choices, it is important to remember that all foods are not the same. Some foods have fewer nutrients per serving than other foods, even though they might have the same number of calories or carbohydrates. It is difficult to get your body what it needs when you eat foods with fewer  nutrients. Examples of foods that you should avoid that are high in calories and carbohydrates but low in nutrients include:  Trans fats (most processed foods list trans fats on the Nutrition Facts label).  Regular soda.  Juice.  Candy.  Sweets, such as cake, pie, doughnuts, and cookies.  Fried foods. WHAT FOODS CAN I EAT? Eat nutrient-rich foods, which will nourish your body and keep you healthy. The food you should eat also will depend on several factors, including:  The calories you need.  The medicines you take.  Your weight.  Your blood glucose level.  Your blood pressure level.  Your cholesterol level. You should eat a variety of foods, including:  Protein.  Lean cuts of meat.  Proteins low in saturated fats, such as fish, egg whites, and beans. Avoid processed meats.  Fruits and vegetables.  Fruits and vegetables that may help control blood glucose levels, such as apples, mangoes, and yams.  Dairy products.  Choose fat-free or low-fat dairy products, such as milk, yogurt, and cheese.  Grains, bread, pasta, and rice.  Choose whole grain products, such as multigrain bread, whole oats, and brown rice. These foods may help control blood pressure.  Fats.  Foods containing healthful fats, such as nuts, avocado, olive oil, canola oil, and fish. DOES EVERYONE WITH DIABETES MELLITUS HAVE THE SAME MEAL PLAN? Because every person with diabetes mellitus is different, there is not one meal plan that works for everyone. It is very important that you meet with a dietitian who will help you create a meal plan that is just right for you.   This information  is not intended to replace advice given to you by your health care provider. Make sure you discuss any questions you have with your health care provider.   Document Released: 11/14/2004 Document Revised: 03/10/2014 Document Reviewed: 01/14/2013 Elsevier Interactive Patient Education [employer]2016 [employer]

## 2015-08-21 NOTE — Progress Notes (Signed)
Subjective:  [name] is a 30 y.o. G3P1011 at 1464w4d being seen today for ongoing prenatal care.  She is currently monitored for the following issues for this high-risk pregnancy and has Atypical squamous cell changes of undetermined significance (ASCUS) on cervical cytology with positive high risk human papilloma virus (HPV); Supervision of high risk pregnancy, antepartum; Gestational diabetes mellitus (GDM), antepartum; and Previous cesarean delivery affecting pregnancy, antepartum on her problem list.  Patient reports no complaints.  Contractions: Not present. Vag. Bleeding: None.  Movement: Present. Denies leaking of fluid.   The following portions of the patient's history were reviewed and updated as appropriate: allergies, current medications, past family history, past medical history, past social history, past surgical history and problem list. Problem list updated.  Objective:   Filed Vitals:   08/21/15 0915  BP: 106/56  Pulse: 69  Temp: 98 F (36.7 C)  Weight: 151 lb 6.4 oz (68.675 kg)    Fetal Status: Fetal Heart Rate (bpm): 146 Fundal Height: 34 cm Movement: Present     General:  Alert, oriented and cooperative. Patient is in no acute distress.  Skin: Skin is warm and dry. No rash noted.   Cardiovascular: Normal heart rate noted  Respiratory: Normal respiratory effort, no problems with respiration noted  Abdomen: Soft, gravid, appropriate for gestational age. Pain/Pressure: Present     Pelvic: Cervical exam deferred        Extremities: Normal range of motion.  Edema: None  Mental Status: Normal mood and affect. Normal behavior. Normal judgment and thought content.   Urinalysis: Urine Protein: Negative Urine Glucose: Negative  Assessment and Plan:  Pregnancy: G3P1011 at 2064w4d  1. Atypical squamous cell changes of undetermined significance (ASCUS) on cervical cytology with positive high risk human papilloma virus (HPV) --Reviewed pt chart.  ASCUS Pap with HPV in 2015,  then follow up Pap in 1 year was [facility]. Then Pap at initial OB in 04/2015. Results noted as negative but no lab record.  Records requested to determine when pt needs repeat Pap.   2. Previous cesarean delivery affecting pregnancy, antepartum --Desires repeat C/S.  3. Gestational diabetes mellitus (GDM), antepartum, gestational diabetes method of control unspecified --Does not have glucose log but is testing and results discussed.  PP mostly 80s to 90s with 2 at 132 and 161 after eating higher carbohydrate meals over the weekend.  Pt concerned about blood sugars, these two values made her worry.  Discussed dietary changes. Pt overall following diet recommendations and glucose wnl for most values.  Will continue to watch.    Preterm labor symptoms and general obstetric precautions including but not limited to vaginal bleeding, contractions, leaking of fluid and fetal movement were reviewed in detail with the patient. Please refer to After Visit Summary for other counseling recommendations.  Return in about 2 weeks (around 09/04/2015).   Hurshel PartyLisa A Leftwich-Kirby, CNM

## 2015-08-23 ENCOUNTER — Ambulatory Visit: Payer: BLUE CROSS/BLUE SHIELD | Admitting: *Deleted

## 2015-08-23 VITALS — Ht <= 58 in | Wt 148.0 lb

## 2015-08-23 DIAGNOSIS — O24419 Gestational diabetes mellitus in pregnancy, unspecified control: Secondary | ICD-10-CM

## 2015-08-23 NOTE — Progress Notes (Signed)
Nutrition note: GDM diet education Pt received GDM education about checking her BS a few weeks ago but did not receive full GDM diet education so is here to just do that. Pt has gained 20# @ 3259w6d, which is slightly > expected. Pt has been checking her BS- fasting: 70-92 (1 out of recommended range), 2hr pp: 88-161 (2 out of recommended range). Pt reported that she was very stressed when she saw the 161 (after eating 3 tacos)- encouraged pt that her numbers overall are great & discussed how stress can increase BS. Pt reports eating 3 meals & 2 snacks/d. Pt is taking a PNV. Pt reports no N&V or heartburn. Pt reports walking ~30 mins/d. Pt received verbal & written education on GDM diet education. Discussed wt gain goals of 15-25# or 0.6#/wk. Pt agrees to follow GDM diet with 3 meals & 1-3 snacks/d with proper CHO/ protein combination. Pt has WIC & plans to BF. F/u in 2-4 wks Blondell RevealLaura Emerald Gehres, MS, RD, LDN, [facility]

## 2015-08-27 ENCOUNTER — Ambulatory Visit: Payer: BLUE CROSS/BLUE SHIELD | Admitting: *Deleted

## 2015-08-29 ENCOUNTER — Other Ambulatory Visit: Payer: Self-pay

## 2015-08-29 DIAGNOSIS — O24419 Gestational diabetes mellitus in pregnancy, unspecified control: Secondary | ICD-10-CM

## 2015-08-29 MED ORDER — ACCU-CHEK SOFT TOUCH LANCETS MISC: Status: DC

## 2015-08-29 MED ORDER — ACCU-CHEK AVIVA PLUS W/DEVICE KIT: 1.0000 | PACK | Freq: Once | Status: DC

## 2015-08-29 MED ORDER — GLUCOSE BLOOD VI STRP: ORAL_STRIP | Status: DC

## 2015-08-29 NOTE — Telephone Encounter (Signed)
Pt diabetes medications has been called into her pharmacy.

## 2015-09-06 ENCOUNTER — Other Ambulatory Visit: Payer: Self-pay | Admitting: General Practice

## 2015-09-06 DIAGNOSIS — O24410 Gestational diabetes mellitus in pregnancy, diet controlled: Secondary | ICD-10-CM

## 2015-09-06 MED ORDER — BAYER MICROLET LANCETS MISC: Status: DC

## 2015-09-12 ENCOUNTER — Ambulatory Visit (INDEPENDENT_AMBULATORY_CARE_PROVIDER_SITE_OTHER): Payer: BLUE CROSS/BLUE SHIELD | Admitting: Family Medicine

## 2015-09-12 VITALS — BP 110/56 | HR 79 | Temp 97.4°F | Wt 155.1 lb

## 2015-09-12 DIAGNOSIS — O24419 Gestational diabetes mellitus in pregnancy, unspecified control: Secondary | ICD-10-CM

## 2015-09-12 DIAGNOSIS — O0993 Supervision of high risk pregnancy, unspecified, third trimester: Secondary | ICD-10-CM

## 2015-09-12 DIAGNOSIS — O34219 Maternal care for unspecified type scar from previous cesarean delivery: Secondary | ICD-10-CM

## 2015-09-12 LAB — POCT URINALYSIS DIP (DEVICE)
Bilirubin Urine: NEGATIVE
GLUCOSE, UA: NEGATIVE mg/dL
Ketones, ur: NEGATIVE mg/dL
Nitrite: NEGATIVE
PH: 5.5 (ref 5.0–8.0)
PROTEIN: NEGATIVE mg/dL
UROBILINOGEN UA: 0.2 mg/dL (ref 0.0–1.0)

## 2015-09-12 NOTE — Patient Instructions (Signed)

## 2015-09-12 NOTE — Progress Notes (Signed)
Subjective:  Samantha DroneFabiola Depree is a 30 y.o. G3P1011 at 1430w5d being seen today for ongoing prenatal care.  She is currently monitored for the following issues for this high-risk pregnancy and has Atypical squamous cell changes of undetermined significance (ASCUS) on cervical cytology with positive high risk human papilloma virus (HPV); Supervision of high risk pregnancy, antepartum; Gestational diabetes mellitus (GDM), antepartum; and Previous cesarean delivery affecting pregnancy, antepartum on her problem list.  Patient reports no complaints.  Contractions: Not present. Vag. Bleeding: None.  Movement: Present. Denies leaking of fluid.   GDM: Diet controlled.  Reports no hypoglycemic episodes.  Tolerating medication well Fasting: 1 out of 14 elevated.  2hr PP: 2 elevated (values at 121)   The following portions of the patient's history were reviewed and updated as appropriate: allergies, current medications, past family history, past medical history, past social history, past surgical history and problem list. Problem list updated.  Objective:   Filed Vitals:   09/12/15 1459  BP: 110/56  Pulse: 79  Temp: 97.4 F (36.3 C)  Weight: 155 lb 1.6 oz (70.353 kg)    Fetal Status:     Movement: Present     General:  Alert, oriented and cooperative. Patient is in no acute distress.  Skin: Skin is warm and dry. No rash noted.   Cardiovascular: Normal heart rate noted  Respiratory: Normal respiratory effort, no problems with respiration noted  Abdomen: Soft, gravid, appropriate for gestational age. Pain/Pressure: Absent     Pelvic:  Cervical exam deferred        Extremities: Normal range of motion.  Edema: None  Mental Status: Normal mood and affect. Normal behavior. Normal judgment and thought content.   Urinalysis: Urine Protein: Negative Urine Glucose: Negative  Assessment and Plan:  Pregnancy: G3P1011 at 7530w5d  1. Supervision of high risk pregnancy, antepartum, third trimester FHT and FH  normal.  2. Gestational diabetes mellitus (GDM), antepartum, gestational diabetes method of control unspecified Controlled.  3. Previous cesarean delivery affecting pregnancy, antepartum Will schedule cesarean   Preterm labor symptoms and general obstetric precautions including but not limited to vaginal bleeding, contractions, leaking of fluid and fetal movement were reviewed in detail with the patient. Please refer to After Visit Summary for other counseling recommendations.  Return in about 2 weeks (around 09/26/2015) for HR OB f/u.   Levie HeritageJacob J Lewellyn Fultz, DO

## 2015-09-13 ENCOUNTER — Encounter (HOSPITAL_COMMUNITY): Payer: Self-pay | Admitting: *Deleted

## 2015-09-17 ENCOUNTER — Other Ambulatory Visit: Payer: Self-pay | Admitting: Family Medicine

## 2015-09-20 ENCOUNTER — Telehealth: Payer: Self-pay | Admitting: *Deleted

## 2015-09-20 NOTE — Telephone Encounter (Signed)
Patient returned call stating she was having a little spotting when she pee. No contractions,baby is moving fine and some

## 2015-09-20 NOTE — Telephone Encounter (Signed)
I advised patient this is normal as long as baby is kicking,no contraction, and she is not going through more than one pad per hour. I advised patient to call us back if she should start soaking pas 1-2 per hour,contraction or anything that does not feel normal to her. Patient verbalizes understanding and will follow at her next appt.

## 2015-09-20 NOTE — Telephone Encounter (Signed)
Pt left message stating that sh eis having some spotting since yesterday and wants to know if this is normal.

## 2015-09-21 ENCOUNTER — Encounter (HOSPITAL_COMMUNITY): Payer: Self-pay | Admitting: *Deleted

## 2015-09-21 ENCOUNTER — Inpatient Hospital Stay (HOSPITAL_COMMUNITY): Payer: BLUE CROSS/BLUE SHIELD

## 2015-09-21 ENCOUNTER — Inpatient Hospital Stay (HOSPITAL_COMMUNITY)
Admission: AD | Admit: 2015-09-21 | Discharge: 2015-09-21 | Disposition: A | Discharge status: Home / Self Care | Payer: BLUE CROSS/BLUE SHIELD | Source: Ambulatory Visit | Attending: Obstetrics & Gynecology | Admitting: Obstetrics & Gynecology

## 2015-09-21 DIAGNOSIS — O0992 Supervision of high risk pregnancy, unspecified, second trimester: Secondary | ICD-10-CM

## 2015-09-21 DIAGNOSIS — O24410 Gestational diabetes mellitus in pregnancy, diet controlled: Secondary | ICD-10-CM | POA: Diagnosis not present

## 2015-09-21 DIAGNOSIS — O368130 Decreased fetal movements, third trimester, not applicable or unspecified: Secondary | ICD-10-CM | POA: Insufficient documentation

## 2015-09-21 DIAGNOSIS — Z3A36 36 weeks gestation of pregnancy: Secondary | ICD-10-CM | POA: Diagnosis present

## 2015-09-21 DIAGNOSIS — O4693 Antepartum hemorrhage, unspecified, third trimester: Secondary | ICD-10-CM | POA: Insufficient documentation

## 2015-09-21 DIAGNOSIS — O34219 Maternal care for unspecified type scar from previous cesarean delivery: Secondary | ICD-10-CM | POA: Insufficient documentation

## 2015-09-21 DIAGNOSIS — O24419 Gestational diabetes mellitus in pregnancy, unspecified control: Secondary | ICD-10-CM

## 2015-09-21 LAB — URINALYSIS, ROUTINE W REFLEX MICROSCOPIC
BILIRUBIN URINE: NEGATIVE
GLUCOSE, UA: NEGATIVE mg/dL
KETONES UR: NEGATIVE mg/dL
Leukocytes, UA: NEGATIVE
NITRITE: NEGATIVE
PH: 6.5 (ref 5.0–8.0)
Protein, ur: NEGATIVE mg/dL
Specific Gravity, Urine: 1.01 (ref 1.005–1.030)

## 2015-09-21 LAB — URINE MICROSCOPIC-ADD ON: Bacteria, UA: NONE SEEN

## 2015-09-21 NOTE — Discharge Instructions (Signed)
Vaginal Bleeding During Pregnancy, Third Trimester °A small amount of bleeding (spotting) from the vagina is relatively common in pregnancy. Various things can cause bleeding or spotting in pregnancy. Sometimes the bleeding is normal and is not a problem. However, bleeding during the third trimester can also be a sign of something serious for the mother and the baby. Be sure to tell your health care provider about any vaginal bleeding right away.  °Some possible causes of vaginal bleeding during the third trimester include:  °· The placenta may be partially or completely covering the opening to the cervix (placenta previa).   °· The placenta may have separated from the uterus (abruption of the placenta).   °· There may be an infection or growth on the cervix.   °· You may be starting labor, called discharging of the mucus plug.   °· The placenta may grow into the muscle layer of the uterus (placenta accreta).   °HOME CARE INSTRUCTIONS  °Watch your condition for any changes. The following actions may help to lessen any discomfort you are feeling:  °· Follow your health care provider's instructions for limiting your activity. If your health care provider orders bed rest, you may need to stay in bed and only get up to use the bathroom. However, your health care provider may allow you to continue light activity. °· If needed, make plans for someone to help with your regular activities and responsibilities while you are on bed rest. °· Keep track of the number of pads you use each day, how often you change pads, and how soaked (saturated) they are. Write this down. °· Do not use tampons. Do not douche. °· Do not have sexual intercourse or orgasms until approved by your health care provider. °· Follow your health care provider's advice about lifting, driving, and physical activities. °· If you pass any tissue from your vagina, save the tissue so you can show it to your health care provider.   °· Only take over-the-counter  or prescription medicines as directed by your health care provider. °· Do not take aspirin because it can make you bleed.   °· Keep all follow-up appointments as directed by your health care provider. °SEEK MEDICAL CARE IF: °· You have any vaginal bleeding during any part of your pregnancy. °· You have cramps or labor pains. °· You have a fever, not controlled by medicine. °SEEK IMMEDIATE MEDICAL CARE IF:  °· You have severe cramps or pain in your back or belly (abdomen). °· You have chills. °· You have a gush of fluid from the vagina. °· You pass large clots or tissue from your vagina. °· Your bleeding increases. °· You feel light-headed or weak. °· You pass out. °· You feel less movement or no movement of the baby.   °MAKE SURE YOU: °· Understand these instructions. °· Will watch your condition. °· Will get help right away if you are not doing well or get worse. °  °This information is not intended to replace advice given to you by your health care provider. Make sure you discuss any questions you have with your health care provider. °  °Document Released: 05/10/2002 Document Revised: 02/22/2013 Document Reviewed: 10/25/2012 °Elsevier Interactive Patient Education ©2016 Elsevier Inc. ° °

## 2015-09-21 NOTE — MAU Provider Note (Signed)
Chief Complaint:  Decreased Fetal Movement   None     HPI: Samantha Suarez is a 30 y.o. G3P1011 at 97w0dwho presents to maternity admissions reporting light spotting with onset 2 days ago changing from pink to red today but remaining light, decreased fetal movement today, and irregular cramping that is worsening today, described as tightening in her abdomen that is intermittent and unchanged since onset.  She tried drinking fluids and eating but still did not feel much movement.  She has not tried any treatments for her cramping or bleeding.     She reports good fetal movement, denies LOF, vaginal itching/burning, urinary symptoms, h/a, dizziness, n/v, or fever/chills.    HPI  Past Medical History: Past Medical History  Diagnosis Date  . Abnormal Pap smear of cervix 05/13/2013    ASC-US +HPV  . Gestational diabetes     Past obstetric history: OB History  Gravida Para Term Preterm AB SAB TAB Ectopic Multiple Living  3 1 1  0 1 1 0 0 0 1    # Outcome Date GA Lbr Len/2nd Weight Sex Delivery Anes PTL Lv  3 Current           2 Term 10/12/12 [id number]:33 / 04:17 7 lb 6.5 oz (3.36 kg) F CS-LTranv EPI  Y  1 SAB 05/2011              Past Surgical History: Past Surgical History  Procedure Laterality Date  . No past surgeries    . Cesarean section N/A 10/12/2012    Procedure: CESAREAN SECTION;  Surgeon: RiSharene ButtersMD;  [facility];  Service: Obstetrics;  Laterality: N/A;    Family History: Family History  Problem Relation Age of Onset  . Hyperlipidemia Mother     Social History: Social History  Substance Use Topics  . Smoking status: Never Smoker   . Smokeless tobacco: Never Used  . Alcohol Use: No    Allergies: No Known Allergies  Meds:  No prescriptions prior to admission    ROS:  Review of Systems  Constitutional: Negative for fever, chills and fatigue.  Eyes: Negative for visual disturbance.  Respiratory: Negative for shortness of breath.    Cardiovascular: Negative for chest pain.  Gastrointestinal: Positive for abdominal pain. Negative for nausea and vomiting.  Genitourinary: Positive for vaginal bleeding and pelvic pain. Negative for dysuria, flank pain, vaginal discharge, difficulty urinating and vaginal pain.  Neurological: Negative for dizziness and headaches.  Psychiatric/Behavioral: Negative.      I have reviewed patient's Past Medical Hx, Surgical Hx, Family Hx, Social Hx, medications and allergies.   Physical Exam   Patient Vitals for the past 24 hrs:  BP Temp Temp src Pulse Resp  09/21/15 1541 110/68 mmHg - - 64 16  09/21/15 1318 (!) 109/53 mmHg 98 F (36.7 C) Oral 71 16   Constitutional: Well-developed, well-nourished female in no acute distress.  Cardiovascular: normal rate Respiratory: normal effort GI: Abd soft, non-tender, gravid appropriate for gestational age.  MS: Extremities nontender, no edema, normal ROM Neurologic: Alert and oriented x 4.  GU: Neg CVAT.  PELVIC EXAM: Cervix pink, visually closed, without lesion, scant white creamy discharge, vaginal walls and external genitalia normal Bimanual exam: Cervix 0/long/high, firm, anterior, neg CMT, uterus nontender, nonenlarged, adnexa without tenderness, enlargement, or mass  Dilation: Fingertip Effacement (%): Thick Station:  (high) Exam by:: L Leftwich-Kirby CNM  FHT:  Baseline 135, moderate variability, accelerations present, no decelerations Contractions: 3-10 minutes, irregular, mild to  palpation   Labs: Results for orders placed or performed during the hospital encounter of 09/21/15 (from the past 24 hour(s))  Urinalysis, Routine w reflex microscopic (not at [facility])     Status: Abnormal   Collection Time: 09/21/15  1:10 PM  Result Value Ref Range   Color, Urine YELLOW YELLOW   APPearance CLEAR CLEAR   Specific Gravity, Urine 1.010 1.005 - 1.030   pH 6.5 5.0 - 8.0   Glucose, UA NEGATIVE NEGATIVE mg/dL   Hgb urine dipstick SMALL (A)  NEGATIVE   Bilirubin Urine NEGATIVE NEGATIVE   Ketones, ur NEGATIVE NEGATIVE mg/dL   Protein, ur NEGATIVE NEGATIVE mg/dL   Nitrite NEGATIVE NEGATIVE   Leukocytes, UA NEGATIVE NEGATIVE  Urine microscopic-add on     Status: Abnormal   Collection Time: 09/21/15  1:10 PM  Result Value Ref Range   Squamous Epithelial / LPF 0-5 (A) NONE SEEN   WBC, UA 0-5 0 - 5 WBC/hpf   RBC / HPF 0-5 0 - 5 RBC/hpf   Bacteria, UA NONE SEEN NONE SEEN   O/Positive/-- (02/27 0000)  Imaging:  No results found.  MAU Course/MDM: I have ordered labs and reviewed results. Reviewed FHR tracing and toco.  No evidence of active labor and US showing no placental abnormalities.  Consult Dr Harolyn Rutherford.  D/C home with labor and bleeding precautions/reasons to return to MAU reviewed.  Pt stable at time of discharge.  Assessment: 1. Vaginal bleeding in pregnancy, third trimester   2. Previous cesarean delivery affecting pregnancy, antepartum   3. Supervision of high risk pregnancy, antepartum, second trimester   4. Gestational diabetes mellitus (GDM), antepartum, gestational diabetes method of control unspecified     Plan: Discharge home Labor precautions and fetal kick counts     Follow-up Information    [facility] with [facility].   Specialty:  Obstetrics and Gynecology   Why:  As scheduled, Return to MAU as needed for emergencies   Contact information:   [place] [phone]       Medication List    TAKE these medications        ACCU-CHEK AVIVA PLUS w/Device Kit  1 Device by Does not apply route once.     BAYER MICROLET LANCETS lancets  4 x a day     glucose blood test strip  Commonly known as:  ACCU-CHEK AVIVA  Patient should check her blood sugars every four hours 024.219     PRENATAL VITAMIN PO  Take by mouth.        Fatima Blank Certified Nurse-Midwife 09/21/2015 4:07 PM

## 2015-09-21 NOTE — MAU Note (Signed)
C/o intermittent ucs since Wed but today around 0900 the cramping became constant; also c/o decreased fetal movement since yesterday around 0500;

## 2015-09-26 ENCOUNTER — Telehealth: Payer: Self-pay | Admitting: *Deleted

## 2015-09-26 NOTE — Telephone Encounter (Signed)
Received message left on nurse voicemail by patient 09/26/15 at 0904.  Patient states she has lost her mucous plug.  States she is [redacted] wks pregnant and wants to know if she should be concerned.  Requests a return call at [phone].

## 2015-10-01 NOTE — Telephone Encounter (Signed)
[facility] and unable to leave a message- phone rang at least 10 times, but no voicemail picked up.

## 2015-10-04 ENCOUNTER — Encounter: Payer: Self-pay | Admitting: Medical

## 2015-10-04 ENCOUNTER — Ambulatory Visit (INDEPENDENT_AMBULATORY_CARE_PROVIDER_SITE_OTHER): Payer: BLUE CROSS/BLUE SHIELD | Admitting: Medical

## 2015-10-04 VITALS — BP 116/60 | HR 82 | Wt 152.7 lb

## 2015-10-04 DIAGNOSIS — O0993 Supervision of high risk pregnancy, unspecified, third trimester: Secondary | ICD-10-CM

## 2015-10-04 DIAGNOSIS — O24410 Gestational diabetes mellitus in pregnancy, diet controlled: Secondary | ICD-10-CM

## 2015-10-04 LAB — POCT URINALYSIS DIP (DEVICE)
Bilirubin Urine: NEGATIVE
Glucose, UA: NEGATIVE mg/dL
Ketones, ur: NEGATIVE mg/dL
NITRITE: NEGATIVE
PH: 6.5 (ref 5.0–8.0)
Protein, ur: NEGATIVE mg/dL
Specific Gravity, Urine: <=1.005 (ref 1.005–1.030)
UROBILINOGEN UA: 0.2 mg/dL (ref 0.0–1.0)

## 2015-10-04 NOTE — Progress Notes (Signed)
OB f/u US scheduled August 17th @ 1245.  Pt notified.

## 2015-10-04 NOTE — Patient Instructions (Signed)
Braxton Hicks Contractions °Contractions of the uterus can occur throughout pregnancy. Contractions are not always a sign that you are in labor.  °WHAT ARE BRAXTON HICKS CONTRACTIONS?  °Contractions that occur before labor are called Braxton Hicks contractions, or false labor. Toward the end of pregnancy (32-34 weeks), these contractions can develop more often and may become more forceful. This is not true labor because these contractions do not result in opening (dilatation) and thinning of the cervix. They are sometimes difficult to tell apart from true labor because these contractions can be forceful and people have different pain tolerances. You should not feel embarrassed if you go to the hospital with false labor. Sometimes, the only way to tell if you are in true labor is for your health care provider to look for changes in the cervix. °If there are no prenatal problems or other health problems associated with the pregnancy, it is completely safe to be sent home with false labor and await the onset of true labor. °HOW CAN YOU TELL THE DIFFERENCE BETWEEN TRUE AND FALSE LABOR? °False Labor °· The contractions of false labor are usually shorter and not as hard as those of true labor.   °· The contractions are usually irregular.   °· The contractions are often felt in the front of the lower abdomen and in the groin.   °· The contractions may go away when you walk around or change positions while lying down.   °· The contractions get weaker and are shorter lasting as time goes on.   °· The contractions do not usually become progressively stronger, regular, and closer together as with true labor.   °True Labor °· Contractions in true labor last 30-70 seconds, become very regular, usually become more intense, and increase in frequency.   °· The contractions do not go away with walking.   °· The discomfort is usually felt in the top of the uterus and spreads to the lower abdomen and low back.   °· True labor can be  determined by your health care provider with an exam. This will show that the cervix is dilating and getting thinner.   °WHAT TO REMEMBER °· Keep up with your usual exercises and follow other instructions given by your health care provider.   °· Take medicines as directed by your health care provider.   °· Keep your regular prenatal appointments.   °· Eat and drink lightly if you think you are going into labor.   °· If Braxton Hicks contractions are making you uncomfortable:   °¨ Change your position from lying down or resting to walking, or from walking to resting.   °¨ Sit and rest in a tub of warm water.   °¨ Drink 2-3 glasses of water. Dehydration may cause these contractions.   °¨ Do slow and deep breathing several times an hour.   °WHEN SHOULD I SEEK IMMEDIATE MEDICAL CARE? °Seek immediate medical care if: °· Your contractions become stronger, more regular, and closer together.   °· You have fluid leaking or gushing from your vagina.   °· You have a fever.   °· You pass blood-tinged mucus.   °· You have vaginal bleeding.   °· You have continuous abdominal pain.   °· You have low back pain that you never had before.   °· You feel your baby's head pushing down and causing pelvic pressure.   °· Your baby is not moving as much as it used to.   °  °This information is not intended to replace advice given to you by your health care provider. Make sure you discuss any questions you have with your health care   provider. °  °Document Released: 02/17/2005 Document Revised: 02/22/2013 Document Reviewed: 11/29/2012 °Elsevier Interactive Patient Education ©2016 Elsevier Inc. ° °

## 2015-10-04 NOTE — Progress Notes (Signed)
  Subjective:  Samantha Suarez is a 30 y.o. G3P1011 at [redacted]w[redacted]d being seen today for ongoing prenatal care.  She is currently monitored for the following issues for this high-risk pregnancy and has Atypical squamous cell changes of undetermined significance (ASCUS) on cervical cytology with positive high risk human papilloma virus (HPV); Supervision of high risk pregnancy, antepartum; Gestational diabetes mellitus (GDM), antepartum; and Previous cesarean delivery affecting pregnancy, antepartum on her problem list.  Patient reports no complaints. Since last visit patient seen in MAU with contractions and spotting. Both have resolved.  Contractions: Not present. Vag. Bleeding: None.  Movement: Present. Denies leaking of fluid.   The following portions of the patient's history were reviewed and updated as appropriate: allergies, current medications, past family history, past medical history, past social history, past surgical history and problem list. Problem list updated.  Objective:   Vitals:   10/04/15 1306  BP: 116/60  Pulse: 82  Weight: 152 lb 11.2 oz (69.3 kg)    Fetal Status: Fetal Heart Rate (bpm): 148 Fundal Height: 38 cm Movement: Present     General:  Alert, oriented and cooperative. Patient is in no acute distress.  Skin: Skin is warm and dry. No rash noted.   Cardiovascular: Normal heart rate noted  Respiratory: Normal respiratory effort, no problems with respiration noted  Abdomen: Soft, gravid, appropriate for gestational age. Pain/Pressure: Present     Pelvic:  Cervical exam performed Dilation: Fingertip Effacement (%): 20 Station: -3  Extremities: Normal range of motion.  Edema: None  Mental Status: Normal mood and affect. Normal behavior. Normal judgment and thought content.   Urinalysis: Urine Protein: Negative Urine Glucose: Negative  Assessment and Plan:  Pregnancy: G3P1011 at [redacted]w[redacted]d  1. Supervision of high risk pregnancy in third trimester - GC/Chlamydia probe amp  ([place])not at [facility] - Culture, beta strep (group b only)  2. Diet controlled gestational diabetes mellitus in third trimester - [place] MFM OB FOLLOW UP; scheduled for growth at 38 weeks  Term labor symptoms and general obstetric precautions including but not limited to vaginal bleeding, contractions, leaking of fluid and fetal movement were reviewed in detail with the patient. Please refer to After Visit Summary for other counseling recommendations.  Return in about 1 week (around 10/11/2015) for return OB as scheduled .   Marny Lowenstein, PA-C

## 2015-10-05 LAB — GC/CHLAMYDIA PROBE AMP (~~LOC~~) NOT AT ARMC
CHLAMYDIA, DNA PROBE: NEGATIVE
Neisseria Gonorrhea: NEGATIVE

## 2015-10-05 NOTE — Telephone Encounter (Signed)
Patient has already been seen in the clinic.

## 2015-10-06 LAB — CULTURE, BETA STREP (GROUP B ONLY)

## 2015-10-08 ENCOUNTER — Encounter (HOSPITAL_COMMUNITY): Payer: Self-pay

## 2015-10-11 ENCOUNTER — Encounter: Payer: Self-pay | Admitting: Obstetrics & Gynecology

## 2015-10-11 ENCOUNTER — Ambulatory Visit (INDEPENDENT_AMBULATORY_CARE_PROVIDER_SITE_OTHER): Payer: BLUE CROSS/BLUE SHIELD | Admitting: Obstetrics & Gynecology

## 2015-10-11 VITALS — BP 113/65 | HR 63 | Wt 153.0 lb

## 2015-10-11 DIAGNOSIS — O24419 Gestational diabetes mellitus in pregnancy, unspecified control: Secondary | ICD-10-CM

## 2015-10-11 DIAGNOSIS — O0993 Supervision of high risk pregnancy, unspecified, third trimester: Secondary | ICD-10-CM

## 2015-10-11 LAB — POCT URINALYSIS DIP (DEVICE)
Bilirubin Urine: NEGATIVE
GLUCOSE, UA: NEGATIVE mg/dL
KETONES UR: NEGATIVE mg/dL
Leukocytes, UA: NEGATIVE
Nitrite: NEGATIVE
PH: 6.5 (ref 5.0–8.0)
PROTEIN: NEGATIVE mg/dL
SPECIFIC GRAVITY, URINE: 1.01 (ref 1.005–1.030)
UROBILINOGEN UA: 0.2 mg/dL (ref 0.0–1.0)

## 2015-10-11 NOTE — Progress Notes (Signed)
Subjective:  Samantha Suarez is a 30 y.o. G3P1011 at 7254w6d being seen today for ongoing prenatal care.  She is currently monitored for the following issues for this high-risk pregnancy and has Atypical squamous cell changes of undetermined significance (ASCUS) on cervical cytology with positive high risk human papilloma virus (HPV); Supervision of high risk pregnancy, antepartum; Gestational diabetes mellitus (GDM), antepartum; and Previous cesarean delivery affecting pregnancy, antepartum on her problem list.  Patient reports no complaints.  Contractions: Not present. Vag. Bleeding: None.  Movement: Present. Denies leaking of fluid.   The following portions of the patient's history were reviewed and updated as appropriate: allergies, current medications, past family history, past medical history, past social history, past surgical history and problem list. Problem list updated.  Objective:   Vitals:   10/11/15 1541  BP: 113/65  Pulse: 63  Weight: 153 lb (69.4 kg)    Fetal Status: Fetal Heart Rate (bpm): 128   Movement: Present     General:  Alert, oriented and cooperative. Patient is in no acute distress.  Skin: Skin is warm and dry. No rash noted.   Cardiovascular: Normal heart rate noted  Respiratory: Normal respiratory effort, no problems with respiration noted  Abdomen: Soft, gravid, appropriate for gestational age. Pain/Pressure: Present     Pelvic:  Cervical exam deferred        Extremities: Normal range of motion.  Edema: None  Mental Status: Normal mood and affect. Normal behavior. Normal judgment and thought content.   Urinalysis: Urine Protein: Negative Urine Glucose: Negative  Assessment and Plan:  Pregnancy: G3P1011 at 3754w6d  1. Supervision of high risk pregnancy, antepartum, third trimester - Her repeat c/s is next week  2. Gestational diabetes mellitus (GDM), antepartum, gestational diabetes method of control unspecified - Excellent sugars - We discussed the need  for 2 hour GTT post partum and the 50% risk of becoming diabetic in her future  Term labor symptoms and general obstetric precautions including but not limited to vaginal bleeding, contractions, leaking of fluid and fetal movement were reviewed in detail with the patient. Please refer to After Visit Summary for other counseling recommendations.  No Follow-up on file.   Samantha BossierMyra C Kayin Kettering, MD

## 2015-10-15 ENCOUNTER — Encounter (HOSPITAL_COMMUNITY)
Admission: RE | Admit: 2015-10-15 | Discharge: 2015-10-15 | Disposition: A | Discharge status: Home / Self Care | Payer: BLUE CROSS/BLUE SHIELD | Source: Ambulatory Visit | Attending: Family Medicine | Admitting: Family Medicine

## 2015-10-15 LAB — CBC
HEMATOCRIT: 35.1 % — AB (ref 36.0–46.0)
HEMOGLOBIN: 12.4 g/dL (ref 12.0–15.0)
MCH: 31.2 pg (ref 26.0–34.0)
MCHC: 35.3 g/dL (ref 30.0–36.0)
MCV: 88.2 fL (ref 78.0–100.0)
Platelets: 204 10*3/uL (ref 150–400)
RBC: 3.98 MIL/uL (ref 3.87–5.11)
RDW: 13.4 % (ref 11.5–15.5)
WBC: 8.8 10*3/uL (ref 4.0–10.5)

## 2015-10-15 LAB — BASIC METABOLIC PANEL
Anion gap: 7 (ref 5–15)
BUN: 16 mg/dL (ref 6–20)
CHLORIDE: 108 mmol/L (ref 101–111)
CO2: 18 mmol/L — ABNORMAL LOW (ref 22–32)
Calcium: 8.7 mg/dL — ABNORMAL LOW (ref 8.9–10.3)
Creatinine, Ser: 0.51 mg/dL (ref 0.44–1.00)
GFR calc Af Amer: >60 mL/min (ref >60)
GFR calc non Af Amer: >60 mL/min (ref >60)
Glucose, Bld: 78 mg/dL (ref 65–99)
POTASSIUM: 3.8 mmol/L (ref 3.5–5.1)
SODIUM: 133 mmol/L — AB (ref 135–145)

## 2015-10-15 LAB — RAPID HIV SCREEN (HIV 1/2 AB+AG)
HIV 1/2 Antibodies: NONREACTIVE
HIV-1 P24 Antigen - HIV24: NONREACTIVE

## 2015-10-15 LAB — TYPE AND SCREEN
ABO/RH(D): O POS
ANTIBODY SCREEN: NEGATIVE

## 2015-10-15 MED ORDER — BUPIVACAINE 0.5 % ON-Q PUMP SINGLE CATH 300 ML
300.0000 mL | INJECTION | Status: DC
  Filled 2015-10-15: qty 300

## 2015-10-15 NOTE — Patient Instructions (Addendum)
[address] DroneFabiola Santagata  10/15/2015   Your procedure is scheduled on:  10/16/2015  [facility] at 0800 AM.  Pick up the phone at the desk and dial [phone].   Call this number if you have problems the morning of surgery: [phone]   Remember:   Do not eat food:After Midnight.  Do not drink clear liquids: After Midnight.  Take these medicines the morning of surgery with A SIP OF WATER: none   Do not wear jewelry, make-up or nail polish.  Do not wear lotions, powders, or perfumes. You may wear deodorant.  Do not shave 48 hours prior to surgery.  Do not bring valuables to the hospital.  [facility] is not   responsible for any belongings or valuables brought to the hospital.  Contacts, dentures or bridgework may not be worn into surgery.  Leave suitcase in the car. After surgery it may be brought to your room.  For patients admitted to the hospital, checkout time is 11:00 AM the day of              discharge.   Patients discharged the day of surgery will not be allowed to drive             home.  Name and phone number of your driver: na  Special Instructions:   N/A   Please read over the following fact sheets that you were given:   Surgical Site Infection Prevention

## 2015-10-16 ENCOUNTER — Inpatient Hospital Stay (HOSPITAL_COMMUNITY)
Admission: RE | Admit: 2015-10-16 | Discharge: 2015-10-18 | DRG: 765 | Disposition: A | Discharge status: Home / Self Care | Payer: BLUE CROSS/BLUE SHIELD | Source: Ambulatory Visit | Attending: Family Medicine | Admitting: Family Medicine

## 2015-10-16 ENCOUNTER — Inpatient Hospital Stay (HOSPITAL_COMMUNITY): Payer: BLUE CROSS/BLUE SHIELD | Admitting: Anesthesiology

## 2015-10-16 ENCOUNTER — Encounter (HOSPITAL_COMMUNITY): Payer: Self-pay

## 2015-10-16 ENCOUNTER — Encounter (HOSPITAL_COMMUNITY)
Admission: RE | Disposition: A | Discharge status: Home / Self Care | Payer: Self-pay | Source: Ambulatory Visit | Attending: Family Medicine

## 2015-10-16 DIAGNOSIS — O34211 Maternal care for low transverse scar from previous cesarean delivery: Principal | ICD-10-CM | POA: Diagnosis present

## 2015-10-16 DIAGNOSIS — O9832 Other infections with a predominantly sexual mode of transmission complicating childbirth: Secondary | ICD-10-CM | POA: Diagnosis present

## 2015-10-16 DIAGNOSIS — Z3A39 39 weeks gestation of pregnancy: Secondary | ICD-10-CM | POA: Diagnosis not present

## 2015-10-16 DIAGNOSIS — A630 Anogenital (venereal) warts: Secondary | ICD-10-CM | POA: Diagnosis present

## 2015-10-16 DIAGNOSIS — Z98891 History of uterine scar from previous surgery: Secondary | ICD-10-CM

## 2015-10-16 DIAGNOSIS — O24420 Gestational diabetes mellitus in childbirth, diet controlled: Secondary | ICD-10-CM | POA: Diagnosis present

## 2015-10-16 LAB — GLUCOSE, CAPILLARY: Glucose-Capillary: 73 mg/dL (ref 65–99)

## 2015-10-16 LAB — RPR: RPR: NONREACTIVE

## 2015-10-16 SURGERY — Surgical Case
Anesthesia: Spinal

## 2015-10-16 MED ORDER — MENTHOL 3 MG MT LOZG: 1.0000 | LOZENGE | OROMUCOSAL | Status: DC | PRN

## 2015-10-16 MED ORDER — OXYTOCIN 10 UNIT/ML IJ SOLN
INTRAMUSCULAR | Status: AC
  Filled 2015-10-16: qty 4

## 2015-10-16 MED ORDER — KETOROLAC TROMETHAMINE 30 MG/ML IJ SOLN: 30.0000 mg | Freq: Four times a day (QID) | INTRAMUSCULAR | Status: AC | PRN

## 2015-10-16 MED ORDER — PRENATAL MULTIVITAMIN CH
1.0000 | ORAL_TABLET | Freq: Every day | ORAL | Status: DC
  Administered 2015-10-17 – 2015-10-18 (×2): 1 via ORAL
  Filled 2015-10-16 (×2): qty 1

## 2015-10-16 MED ORDER — SIMETHICONE 80 MG PO CHEW
80.0000 mg | CHEWABLE_TABLET | ORAL | Status: DC
  Administered 2015-10-16 – 2015-10-17 (×2): 80 mg via ORAL
  Filled 2015-10-16 (×2): qty 1

## 2015-10-16 MED ORDER — IBUPROFEN 600 MG PO TABS
600.0000 mg | ORAL_TABLET | Freq: Four times a day (QID) | ORAL | Status: DC
  Administered 2015-10-16 – 2015-10-18 (×9): 600 mg via ORAL
  Filled 2015-10-16 (×10): qty 1

## 2015-10-16 MED ORDER — LACTATED RINGERS IV SOLN: 125.0000 mL/h | INTRAVENOUS | Status: DC

## 2015-10-16 MED ORDER — PHENYLEPHRINE 8 MG IN D5W 100 ML (0.08MG/ML) PREMIX OPTIME
INJECTION | INTRAVENOUS | Status: AC
  Filled 2015-10-16: qty 100

## 2015-10-16 MED ORDER — SCOPOLAMINE 1 MG/3DAYS TD PT72
1.0000 | MEDICATED_PATCH | Freq: Once | TRANSDERMAL | Status: DC
  Administered 2015-10-16: 1.5 mg via TRANSDERMAL

## 2015-10-16 MED ORDER — KETOROLAC TROMETHAMINE 30 MG/ML IJ SOLN
INTRAMUSCULAR | Status: AC
  Filled 2015-10-16: qty 1

## 2015-10-16 MED ORDER — COCONUT OIL OIL
1.0000 "application " | TOPICAL_OIL | Status: DC | PRN
  Administered 2015-10-17: 1 via TOPICAL
  Filled 2015-10-16: qty 120

## 2015-10-16 MED ORDER — SCOPOLAMINE 1 MG/3DAYS TD PT72
MEDICATED_PATCH | TRANSDERMAL | Status: AC
  Administered 2015-10-16: 1.5 mg via TRANSDERMAL
  Filled 2015-10-16: qty 1

## 2015-10-16 MED ORDER — MORPHINE SULFATE (PF) 0.5 MG/ML IJ SOLN: INTRAMUSCULAR | Status: DC | PRN

## 2015-10-16 MED ORDER — NALBUPHINE HCL 10 MG/ML IJ SOLN: 5.0000 mg | INTRAMUSCULAR | Status: DC | PRN

## 2015-10-16 MED ORDER — DIPHENHYDRAMINE HCL 25 MG PO CAPS: 25.0000 mg | ORAL_CAPSULE | Freq: Four times a day (QID) | ORAL | Status: DC | PRN

## 2015-10-16 MED ORDER — NALOXONE HCL 2 MG/2ML IJ SOSY
1.0000 ug/kg/h | PREFILLED_SYRINGE | INTRAMUSCULAR | Status: DC | PRN
  Filled 2015-10-16: qty 2

## 2015-10-16 MED ORDER — SENNOSIDES-DOCUSATE SODIUM 8.6-50 MG PO TABS
2.0000 | ORAL_TABLET | ORAL | Status: DC
Start: 2015-10-17 — End: 2015-10-18
  Administered 2015-10-16 – 2015-10-17 (×2): 2 via ORAL
  Filled 2015-10-16 (×2): qty 2

## 2015-10-16 MED ORDER — NALOXONE HCL 0.4 MG/ML IJ SOLN: 0.4000 mg | INTRAMUSCULAR | Status: DC | PRN

## 2015-10-16 MED ORDER — PROMETHAZINE HCL 25 MG/ML IJ SOLN: 6.2500 mg | INTRAMUSCULAR | Status: DC | PRN

## 2015-10-16 MED ORDER — ACETAMINOPHEN 325 MG PO TABS: 650.0000 mg | ORAL_TABLET | ORAL | Status: DC | PRN

## 2015-10-16 MED ORDER — TETANUS-DIPHTH-ACELL PERTUSSIS 5-2.5-18.5 LF-MCG/0.5 IM SUSP: 0.5000 mL | Freq: Once | INTRAMUSCULAR | Status: DC

## 2015-10-16 MED ORDER — ACETAMINOPHEN 500 MG PO TABS: 1000.0000 mg | ORAL_TABLET | Freq: Four times a day (QID) | ORAL | Status: AC

## 2015-10-16 MED ORDER — HYDROMORPHONE HCL 1 MG/ML IJ SOLN: 0.2500 mg | INTRAMUSCULAR | Status: DC | PRN

## 2015-10-16 MED ORDER — DIPHENHYDRAMINE HCL 25 MG PO CAPS: 25.0000 mg | ORAL_CAPSULE | ORAL | Status: DC | PRN

## 2015-10-16 MED ORDER — NALBUPHINE HCL 10 MG/ML IJ SOLN: 5.0000 mg | Freq: Once | INTRAMUSCULAR | Status: DC | PRN

## 2015-10-16 MED ORDER — IBUPROFEN 600 MG PO TABS: 600.0000 mg | ORAL_TABLET | Freq: Four times a day (QID) | ORAL | Status: DC | PRN

## 2015-10-16 MED ORDER — LACTATED RINGERS IV SOLN
INTRAVENOUS | Status: DC
  Administered 2015-10-16 (×2): via INTRAVENOUS

## 2015-10-16 MED ORDER — KETOROLAC TROMETHAMINE 30 MG/ML IJ SOLN: 30.0000 mg | Freq: Once | INTRAMUSCULAR | Status: DC

## 2015-10-16 MED ORDER — FENTANYL CITRATE (PF) 100 MCG/2ML IJ SOLN
INTRAMUSCULAR | Status: AC
  Filled 2015-10-16: qty 2

## 2015-10-16 MED ORDER — BUPIVACAINE IN DEXTROSE 0.75-8.25 % IT SOLN
INTRATHECAL | Status: DC | PRN
Start: 2015-10-16 — End: 2015-10-16
  Administered 2015-10-16: 1.4 mL via INTRATHECAL

## 2015-10-16 MED ORDER — DIBUCAINE 1 % RE OINT: 1.0000 "application " | TOPICAL_OINTMENT | RECTAL | Status: DC | PRN

## 2015-10-16 MED ORDER — OXYTOCIN 10 UNIT/ML IJ SOLN
INTRAVENOUS | Status: DC | PRN
  Administered 2015-10-16: 40 [IU] via INTRAVENOUS

## 2015-10-16 MED ORDER — MORPHINE SULFATE-NACL 0.5-0.9 MG/ML-% IV SOSY
PREFILLED_SYRINGE | INTRAVENOUS | Status: AC
  Filled 2015-10-16: qty 1

## 2015-10-16 MED ORDER — ONDANSETRON HCL 4 MG/2ML IJ SOLN
INTRAMUSCULAR | Status: DC | PRN
  Administered 2015-10-16: 4 mg via INTRAVENOUS

## 2015-10-16 MED ORDER — MEPERIDINE HCL 25 MG/ML IJ SOLN: 6.2500 mg | INTRAMUSCULAR | Status: DC | PRN

## 2015-10-16 MED ORDER — WITCH HAZEL-GLYCERIN EX PADS: 1.0000 "application " | MEDICATED_PAD | CUTANEOUS | Status: DC | PRN

## 2015-10-16 MED ORDER — MORPHINE SULFATE (PF) 0.5 MG/ML IJ SOLN
INTRAMUSCULAR | Status: DC | PRN
  Administered 2015-10-16: .2 mg via INTRATHECAL

## 2015-10-16 MED ORDER — LACTATED RINGERS IV SOLN
Freq: Once | INTRAVENOUS | Status: AC
  Administered 2015-10-16: 1000 mL/h via INTRAVENOUS

## 2015-10-16 MED ORDER — PHENYLEPHRINE 8 MG IN D5W 100 ML (0.08MG/ML) PREMIX OPTIME
INJECTION | INTRAVENOUS | Status: DC | PRN
  Administered 2015-10-16: 60 ug/min via INTRAVENOUS

## 2015-10-16 MED ORDER — BUPIVACAINE HCL (PF) 0.5 % IJ SOLN
INTRAMUSCULAR | Status: AC
  Filled 2015-10-16: qty 30

## 2015-10-16 MED ORDER — ONDANSETRON HCL 4 MG/2ML IJ SOLN: 4.0000 mg | Freq: Three times a day (TID) | INTRAMUSCULAR | Status: DC | PRN

## 2015-10-16 MED ORDER — LACTATED RINGERS IV SOLN
INTRAVENOUS | Status: DC
  Administered 2015-10-16: 950 mL via INTRAVENOUS

## 2015-10-16 MED ORDER — OXYCODONE-ACETAMINOPHEN 5-325 MG PO TABS
1.0000 | ORAL_TABLET | ORAL | Status: DC | PRN
  Administered 2015-10-18 (×2): 1 via ORAL
  Filled 2015-10-16 (×2): qty 1

## 2015-10-16 MED ORDER — ZOLPIDEM TARTRATE 5 MG PO TABS: 5.0000 mg | ORAL_TABLET | Freq: Every evening | ORAL | Status: DC | PRN

## 2015-10-16 MED ORDER — LACTATED RINGERS IV SOLN
INTRAVENOUS | Status: DC | PRN
  Administered 2015-10-16: 10:00:00 via INTRAVENOUS

## 2015-10-16 MED ORDER — BUPIVACAINE HCL (PF) 0.5 % IJ SOLN
INTRAMUSCULAR | Status: DC | PRN
  Administered 2015-10-16: 10 mL

## 2015-10-16 MED ORDER — SODIUM CHLORIDE 0.9 % IR SOLN
Status: DC | PRN
  Administered 2015-10-16: 1000 mL

## 2015-10-16 MED ORDER — FENTANYL CITRATE (PF) 100 MCG/2ML IJ SOLN
INTRAMUSCULAR | Status: DC | PRN
  Administered 2015-10-16: 20 ug via INTRATHECAL

## 2015-10-16 MED ORDER — OXYTOCIN 40 UNITS IN LACTATED RINGERS INFUSION - SIMPLE MED: 2.5000 [IU]/h | INTRAVENOUS | Status: AC

## 2015-10-16 MED ORDER — DIPHENHYDRAMINE HCL 50 MG/ML IJ SOLN: 12.5000 mg | INTRAMUSCULAR | Status: DC | PRN

## 2015-10-16 MED ORDER — SIMETHICONE 80 MG PO CHEW
80.0000 mg | CHEWABLE_TABLET | Freq: Three times a day (TID) | ORAL | Status: DC
  Administered 2015-10-16 – 2015-10-18 (×4): 80 mg via ORAL
  Filled 2015-10-16 (×5): qty 1

## 2015-10-16 MED ORDER — CEFAZOLIN SODIUM-DEXTROSE 2-4 GM/100ML-% IV SOLN
2.0000 g | INTRAVENOUS | Status: AC
Start: 2015-10-16 — End: 2015-10-16
  Administered 2015-10-16: 2 g via INTRAVENOUS

## 2015-10-16 MED ORDER — KETOROLAC TROMETHAMINE 30 MG/ML IJ SOLN
30.0000 mg | Freq: Four times a day (QID) | INTRAMUSCULAR | Status: AC | PRN
  Administered 2015-10-16: 30 mg via INTRAMUSCULAR

## 2015-10-16 MED ORDER — SCOPOLAMINE 1 MG/3DAYS TD PT72
1.0000 | MEDICATED_PATCH | Freq: Once | TRANSDERMAL | Status: DC
  Filled 2015-10-16: qty 1

## 2015-10-16 MED ORDER — SODIUM CHLORIDE 0.9% FLUSH: 3.0000 mL | INTRAVENOUS | Status: DC | PRN

## 2015-10-16 MED ORDER — ONDANSETRON HCL 4 MG/2ML IJ SOLN
INTRAMUSCULAR | Status: AC
  Filled 2015-10-16: qty 2

## 2015-10-16 MED ORDER — SIMETHICONE 80 MG PO CHEW: 80.0000 mg | CHEWABLE_TABLET | ORAL | Status: DC | PRN

## 2015-10-16 MED ORDER — OXYCODONE-ACETAMINOPHEN 5-325 MG PO TABS: 2.0000 | ORAL_TABLET | ORAL | Status: DC | PRN

## 2015-10-16 SURGICAL SUPPLY — 34 items
BENZOIN TINCTURE PRP APPL 2/3 (GAUZE/BANDAGES/DRESSINGS) ×3 IMPLANT
CATH ROBINSON RED A/P 16FR (CATHETERS) IMPLANT
CHLORAPREP W/TINT 26ML (MISCELLANEOUS) ×3 IMPLANT
CLAMP CORD UMBIL (MISCELLANEOUS) IMPLANT
CLOSURE WOUND 1/2 X4 (GAUZE/BANDAGES/DRESSINGS) ×1
CLOTH BEACON ORANGE TIMEOUT ST (SAFETY) ×3 IMPLANT
DRSG OPSITE POSTOP 4X10 (GAUZE/BANDAGES/DRESSINGS) ×3 IMPLANT
ELECT REM PT RETURN 9FT ADLT (ELECTROSURGICAL) ×3
ELECTRODE REM PT RTRN 9FT ADLT (ELECTROSURGICAL) ×1 IMPLANT
EXTRACTOR VACUUM M CUP 4 TUBE (SUCTIONS) IMPLANT
EXTRACTOR VACUUM M CUP 4' TUBE (SUCTIONS)
GLOVE BIOGEL PI IND STRL 7.0 (GLOVE) ×1 IMPLANT
GLOVE BIOGEL PI IND STRL 7.5 (GLOVE) ×2 IMPLANT
GLOVE BIOGEL PI INDICATOR 7.0 (GLOVE) ×2
GLOVE BIOGEL PI INDICATOR 7.5 (GLOVE) ×4
GLOVE ECLIPSE 7.5 STRL STRAW (GLOVE) ×3 IMPLANT
GOWN STRL REUS W/TWL LRG LVL3 (GOWN DISPOSABLE) ×9 IMPLANT
KIT ABG SYR 3ML LUER SLIP (SYRINGE) IMPLANT
NEEDLE HYPO 25X5/8 SAFETYGLIDE (NEEDLE) IMPLANT
NS IRRIG 1000ML POUR BTL (IV SOLUTION) ×3 IMPLANT
PACK C SECTION WH (CUSTOM PROCEDURE TRAY) ×3 IMPLANT
PAD OB MATERNITY 4.3X12.25 (PERSONAL CARE ITEMS) ×3 IMPLANT
PENCIL SMOKE EVAC W/HOLSTER (ELECTROSURGICAL) ×3 IMPLANT
RTRCTR C-SECT PINK 25CM LRG (MISCELLANEOUS) ×3 IMPLANT
STRIP CLOSURE SKIN 1/2X4 (GAUZE/BANDAGES/DRESSINGS) ×2 IMPLANT
SUT MNCRL 0 VIOLET CTX 36 (SUTURE) IMPLANT
SUT MONOCRYL 0 CTX 36 (SUTURE)
SUT VIC AB 0 CTX 36 (SUTURE) ×6
SUT VIC AB 0 CTX36XBRD ANBCTRL (SUTURE) ×3 IMPLANT
SUT VIC AB 2-0 CT1 27 (SUTURE) ×2
SUT VIC AB 2-0 CT1 TAPERPNT 27 (SUTURE) ×1 IMPLANT
SUT VIC AB 4-0 KS 27 (SUTURE) ×3 IMPLANT
TOWEL OR 17X24 6PK STRL BLUE (TOWEL DISPOSABLE) ×3 IMPLANT
TRAY FOLEY CATH SILVER 14FR (SET/KITS/TRAYS/PACK) ×3 IMPLANT

## 2015-10-16 NOTE — Op Note (Signed)
Samantha Suarez PROCEDURE DATE: 10/16/2015  PREOPERATIVE DIAGNOSIS: Intrauterine pregnancy at  6228w4d weeks gestation; elective repeat  POSTOPERATIVE DIAGNOSIS: The same  PROCEDURE: Repeat Low Transverse Cesarean Section  SURGEON:  Dr. Candelaria CelesteJacob Issaih Kaus  ASSISTANT: Webb SilversmithHeather Krietemeyer, RNFA.  Margarita SermonsEdward Liu, MS3  INDICATIONS: Samantha Suarez is a 30 y.o. [id number] at 4228w4d scheduled for cesarean section secondary to elective repeat.  The risks of cesarean section discussed with the patient included but were not limited to: bleeding which may require transfusion or reoperation; infection which may require antibiotics; injury to bowel, bladder, ureters or other surrounding organs; injury to the fetus; need for additional procedures including hysterectomy in the event of a life-threatening hemorrhage; placental abnormalities wth subsequent pregnancies, incisional problems, thromboembolic phenomenon and other postoperative/anesthesia complications. The patient concurred with the proposed plan, giving informed written consent for the procedure.    FINDINGS:  Viable female infant in vertex presentation.  Apgars 9 and 10, weight pending.  Clear amniotic fluid.  Intact placenta, three vessel cord.  Normal uterus, fallopian tubes and ovaries bilaterally.  ANESTHESIA:    Spinal INTRAVENOUS FLUIDS: 2200 ml ESTIMATED BLOOD LOSS: 600 ml URINE OUTPUT:  200 ml SPECIMENS: Placenta sent to L&D COMPLICATIONS: None immediate  PROCEDURE IN DETAIL:  The patient received intravenous antibiotics and had sequential compression devices applied to her lower extremities while in the preoperative area.  She was then taken to the operating room where spinal anesthesia was administered (epidural anesthesia was dosed up to surgical level) and was found to be adequate. She was then placed in a dorsal supine position with a leftward tilt, and prepped and draped in a sterile manner.  A foley catheter was placed into her bladder and attached  to constant gravity, which drained clear fluid throughout.  After an adequate timeout was performed, a Pfannenstiel skin incision was made with scalpel and carried through to the underlying layer of fascia. The fascia was incised in the midline and this incision was extended bilaterally using the Mayo scissors. Kocher clamps were applied to the superior aspect of the fascial incision and the underlying rectus muscles were dissected off bluntly. A similar process was carried out on the inferior aspect of the facial incision. The rectus muscles were separated in the midline bluntly and the peritoneum was entered bluntly. An Alexis retractor was placed to aid in visualization of the uterus.  Attention was turned to the lower uterine segment where a transverse hysterotomy was made with a scalpel and extended bilaterally bluntly. The infant was successfully delivered, and cord was clamped and cut and infant was handed over to awaiting neonatology team. Uterine massage was then administered and the placenta delivered intact with three-vessel cord. The uterus was then cleared of clot and debris.  The hysterotomy was closed with 0 Vicryl in a running locked fashion, and an imbricating layer was also placed with a 0 Vicryl. Overall, excellent hemostasis was noted. The abdomen and the pelvis were cleared of all clot and debris and the Jon Gillslexis was removed. Hemostasis was confirmed on all surfaces.  The peritoneum was reapproximated using 2-0 vicryl running stitches. The catheter for an On-Q pump was primed and inserted into the skin and fascia approximately 5cm from the skin incision on the right corner.  This was laid across the rectus muscles. The fascia was then closed using 0 Vicryl in a running fashion.  The On-Q catheter was retracted 1 cm to ensure that it was not entrapped.  The skin was then closed with 4-0 vicryl.  The patient tolerated the procedure well. Sponge, lap, instrument and needle counts were correct x 2. She  was taken to the recovery room in stable condition.    Levie HeritageJacob J Jadiel Schmieder, DO 10/16/2015 10:36 AM

## 2015-10-16 NOTE — Anesthesia Postprocedure Evaluation (Signed)
Anesthesia Post Note  Patient: Samantha Suarez  Procedure(s) Performed: Procedure(s) (LRB): CESAREAN SECTION (N/A)  Patient location during evaluation: PACU Anesthesia Type: Spinal Level of consciousness: awake Pain management: pain level controlled Vital Signs Assessment: post-procedure vital signs reviewed and stable Respiratory status: spontaneous breathing Cardiovascular status: stable Postop Assessment: no headache, no backache, spinal receding, patient able to bend at knees and no signs of nausea or vomiting Anesthetic complications: no     Last Vitals:  Vitals:   10/16/15 1045 10/16/15 1100  BP: 106/69 (!) 106/92  Pulse: 62 (!) 53  Resp: 16 (!) 21  Temp: 36.3 C     Last Pain:  Vitals:   10/16/15 1115  TempSrc:   PainSc: 0-No pain   Pain Goal: Patients Stated Pain Goal: 4 (10/16/15 0825)               Tnia Anglada JR,JOHN Susann GivensFRANKLIN

## 2015-10-16 NOTE — Transfer of Care (Signed)
Immediate Anesthesia Transfer of Care Note  Patient: Samantha Suarez  Procedure(s) Performed: Procedure(s): CESAREAN SECTION (N/A)  Patient Location: PACU  Anesthesia Type:Spinal  Level of Consciousness: awake, alert  and oriented  Airway & Oxygen Therapy: Patient Spontanous Breathing  Post-op Assessment: Report given to RN and Post -op Vital signs reviewed and stable  Post vital signs: Reviewed and stable  Last Vitals:  Vitals:   10/16/15 0825  BP: 109/75  Resp: 16  Temp: 36.6 C    Last Pain:  Vitals:   10/16/15 0825  TempSrc: Oral      Patients Stated Pain Goal: 4 (10/16/15 0825)  Complications: No apparent anesthesia complications

## 2015-10-16 NOTE — H&P (Signed)
Faculty Practice H&P  Samantha Suarez is a 30 y.o. female G3P1011 with IUP at 811w4d presenting for repeat cesarean section for elective repeat. Pregnancy was been complicated by previous cesarean section, gestational diabetes, diet controlled.    Pt states she has been having no contractions, no vaginal bleeding, intact membranes, with normal fetal movement.     Prenatal Course Source of Care: [facility] and transferred to High risk clinic at 29.5 weeks Pregnancy complications or risks: Patient Active Problem List   Diagnosis Date Noted  . Previous cesarean delivery affecting pregnancy, antepartum 08/07/2015  . Supervision of high risk pregnancy, antepartum 08/03/2015  . Gestational diabetes mellitus (GDM), antepartum 08/03/2015  . Atypical squamous cell changes of undetermined significance (ASCUS) on cervical cytology with positive high risk human papilloma virus (HPV) 07/19/2013   She desires to uncertain form of birth control.  She plans to plans to breastfeed  Prenatal labs and studies: ABO, Rh: --/--/O POS (08/14 1136) Antibody: NEG (08/14 1136) Rubella: !Error! RPR: Non Reactive (08/14 1136)  HBsAg: Negative (02/27 0000)  HIV: Non-reactive (02/27 0000)  GBS:    1 hr Glucola 148 3hr GTT: 67 - 155 - 164 - 157. Genetic screeningnormal Anatomy US normal  Past Medical History:  Past Medical History:  Diagnosis Date  . Abnormal Pap smear of cervix 05/13/2013   ASC-US +HPV  . Gestational diabetes     Past Surgical History:  Past Surgical History:  Procedure Laterality Date  . CESAREAN SECTION N/A 10/12/2012   Procedure: CESAREAN SECTION;  Surgeon: Mickel Baasichard D Kaplan, MD;  Location: [facility];  Service: Obstetrics;  Laterality: N/A;  . NO PAST SURGERIES      Obstetrical History:  OB History    Gravida Para Term Preterm AB Living   3 1 1  0 1 1   SAB TAB Ectopic Multiple Live Births   1 0 0 0 1      Social History:  Social History   Social History  . Marital status:  Single    Spouse name: N/A  . Number of children: N/A  . Years of education: N/A   Social History Main Topics  . Smoking status: Never Smoker  . Smokeless tobacco: Never Used  . Alcohol use No  . Drug use: No  . Sexual activity: Yes    Birth control/ protection: Condom   Other Topics Concern  . None   Social History Narrative   ** Merged History Encounter **        Family History:  Family History  Problem Relation Age of Onset  . Hyperlipidemia Mother     Medications:  Prenatal vitamins,  Current Facility-Administered Medications  Medication Dose Route Frequency Provider Last Rate Last Dose  . bupivacaine 0.5 % ON-Q pump SINGLE CATH 300 mL  300 mL Other Continuous Rhona RaiderJacob J Tessia Kassin, DO      . ceFAZolin (ANCEF) IVPB 2g/100 mL premix  2 g Intravenous On Call to OR Rhona RaiderJacob J Lateka Rady, DO      . ketorolac (TORADOL) 30 MG/ML injection 30 mg  30 mg Intravenous Q6H PRN Leilani AbleFranklin Hatchett, MD       Or  . ketorolac (TORADOL) 30 MG/ML injection 30 mg  30 mg Intramuscular Q6H PRN Leilani AbleFranklin Hatchett, MD      . lactated ringers infusion   Intravenous Continuous Shelton SilvasKevin D Hollis, MD      . scopolamine (TRANSDERM-SCOP) 1 MG/3DAYS 1.5 mg  1 patch Transdermal Once Shelton SilvasKevin D Hollis, MD   1.5 mg at  10/16/15 0841    Allergies: No Known Allergies  Review of Systems: - negative  Physical Exam: Blood pressure 109/75, temperature 97.8 F (36.6 C), temperature source Oral, resp. rate 16, last menstrual period 01/12/2015, SpO2 100 %. GENERAL: Well-developed, well-nourished female in no acute distress.  LUNGS: Clear to auscultation bilaterally.  HEART: Regular rate and rhythm. ABDOMEN: Soft, nontender, nondistended, gravid. EFW 7.5 lbs EXTREMITIES: Nontender, no edema, 2+ distal pulses. FHT:  Baseline rate 144 bpm      Pertinent Labs/Studies:   CBC    Component Value Date/Time   WBC 8.8 10/15/2015 1136   RBC 3.98 10/15/2015 1136   HGB 12.4 10/15/2015 1136   HGB 12.0 07/27/2015   HCT 35.1 (L)  10/15/2015 1136   HCT 37 07/27/2015   PLT 204 10/15/2015 1136   PLT 255 04/30/2015   MCV 88.2 10/15/2015 1136   MCH 31.2 10/15/2015 1136   MCHC 35.3 10/15/2015 1136   RDW 13.4 10/15/2015 1136     Assessment : Samantha Suarez is a 30 y.o. G3P1011 at 7553w4d being admitted for cesarean section secondary to elective repeat  Plan: The risks of cesarean section discussed with the patient included but were not limited to: bleeding which may require transfusion or reoperation; infection which may require antibiotics; injury to bowel, bladder, ureters or other surrounding organs; injury to the fetus; need for additional procedures including hysterectomy in the event of a life-threatening hemorrhage; placental abnormalities wth subsequent pregnancies, incisional problems, thromboembolic phenomenon and other postoperative/anesthesia complications. The patient concurred with the proposed plan, giving informed written consent for the procedure.   Patient has been NPO since midnight and will remain NPO for procedure.  Preoperative prophylactic Ancef ordered on call to the OR.    Levie HeritageJacob J Josephus Harriger, DO 10/16/2015, 8:52 AM

## 2015-10-16 NOTE — Anesthesia Preprocedure Evaluation (Signed)
Anesthesia Evaluation  Patient identified by MRN, date of birth, ID band Patient awake    Reviewed: Allergy & Precautions, H&P , NPO status , Patient's Chart, lab work & pertinent test results  Airway Mallampati: I  TM Distance: >3 FB Neck ROM: full    Dental no notable dental hx.    Pulmonary neg pulmonary ROS,    Pulmonary exam normal        Cardiovascular negative cardio ROS Normal cardiovascular exam     Neuro/Psych negative neurological ROS  negative psych ROS   GI/Hepatic negative GI ROS, Neg liver ROS,   Endo/Other  negative endocrine ROSdiabetes  Renal/GU negative Renal ROS     Musculoskeletal   Abdominal (+) + obese,   Peds  Hematology negative hematology ROS (+)   Anesthesia Other Findings   Reproductive/Obstetrics (+) Pregnancy                             Anesthesia Physical Anesthesia Plan  ASA: II  Anesthesia Plan: Spinal   Post-op Pain Management:    Induction:   Airway Management Planned:   Additional Equipment:   Intra-op Plan:   Post-operative Plan:   Informed Consent: I have reviewed the patients History and Physical, chart, labs and discussed the procedure including the risks, benefits and alternatives for the proposed anesthesia with the patient or authorized representative who has indicated his/her understanding and acceptance.     Plan Discussed with:   Anesthesia Plan Comments:         Anesthesia Quick Evaluation

## 2015-10-16 NOTE — Addendum Note (Signed)
Addendum  created 10/16/15 1737 by Yolonda KidaAlison L Artelia Game, CRNA   Sign clinical note

## 2015-10-16 NOTE — Anesthesia Procedure Notes (Addendum)
Spinal  Patient location during procedure: OR Start time: 10/16/2015 9:30 AM End time: 10/16/2015 9:32 AM Staffing Anesthesiologist: Leilani AbleHATCHETT, Shalae Belmonte Performed: anesthesiologist  Preanesthetic Checklist Completed: patient identified, surgical consent, pre-op evaluation, timeout performed, IV checked, risks and benefits discussed and monitors and equipment checked Spinal Block Patient position: sitting Prep: site prepped and draped and DuraPrep Patient monitoring: heart rate, cardiac monitor, continuous pulse ox and blood pressure Approach: midline Location: L3-4 Injection technique: single-shot Needle Needle type: Sprotte  Needle gauge: 24 G Needle length: 9 cm Needle insertion depth: 5 cm Assessment Sensory level: T4

## 2015-10-16 NOTE — Anesthesia Postprocedure Evaluation (Signed)
Anesthesia Post Note  Patient: Samantha Suarez  Procedure(s) Performed: Procedure(s) (LRB): CESAREAN SECTION (N/A)  Patient location during evaluation: Mother Baby Anesthesia Type: Spinal Level of consciousness: awake, awake and alert, oriented and patient cooperative Pain management: pain level controlled Vital Signs Assessment: post-procedure vital signs reviewed and stable Respiratory status: spontaneous breathing, nonlabored ventilation and respiratory function stable Cardiovascular status: stable Postop Assessment: no headache, no backache, no signs of nausea or vomiting and patient able to bend at knees Anesthetic complications: no     Last Vitals:  Vitals:   10/16/15 1430 10/16/15 1541  BP: (!) 98/59 (!) 95/54  Pulse: (!) 51 (!) 54  Resp: 18 16  Temp: 36.4 C 36.6 C    Last Pain:  Vitals:   10/16/15 1541  TempSrc: Oral  PainSc: 0-No pain   Pain Goal: Patients Stated Pain Goal: 0 (10/16/15 1541)               Sitlali Koerner L

## 2015-10-17 DIAGNOSIS — O34211 Maternal care for low transverse scar from previous cesarean delivery: Secondary | ICD-10-CM

## 2015-10-17 DIAGNOSIS — Z3A39 39 weeks gestation of pregnancy: Secondary | ICD-10-CM

## 2015-10-17 DIAGNOSIS — O24420 Gestational diabetes mellitus in childbirth, diet controlled: Secondary | ICD-10-CM

## 2015-10-17 LAB — CBC
HCT: 30.7 % — ABNORMAL LOW (ref 36.0–46.0)
Hemoglobin: 11 g/dL — ABNORMAL LOW (ref 12.0–15.0)
MCH: 31.3 pg (ref 26.0–34.0)
MCHC: 35.8 g/dL (ref 30.0–36.0)
MCV: 87.5 fL (ref 78.0–100.0)
Platelets: 168 10*3/uL (ref 150–400)
RBC: 3.51 MIL/uL — ABNORMAL LOW (ref 3.87–5.11)
RDW: 13.4 % (ref 11.5–15.5)
WBC: 13.5 10*3/uL — ABNORMAL HIGH (ref 4.0–10.5)

## 2015-10-17 LAB — BIRTH TISSUE RECOVERY COLLECTION (PLACENTA DONATION)

## 2015-10-17 NOTE — Plan of Care (Signed)
Problem: Pain Management: Goal: General experience of comfort will improve and pain level will decrease Outcome: Progressing Patient has been experiencing little-to-no pain using the ON-Q pump

## 2015-10-17 NOTE — Progress Notes (Signed)
Subjective: Postpartum Day 1: Cesarean Delivery Patient reports tolerating PO, + flatus and no problems voiding.    Has on-Q pump, seems to be working well. Patient states her pain is well controlled  Objective: Vital signs in last 24 hours: Temp:  [97.4 F (36.3 C)-98.2 F (36.8 C)] 98.1 F (36.7 C) (08/16 0651) Pulse Rate:  [50-75] 60 (08/16 0651) Resp:  [16-21] 18 (08/16 0651) BP: (93-111)/(54-92) 99/55 (08/16 0651) SpO2:  [95 %-100 %] 99 % (08/16 0651) Weight:  [69.4 kg (153 lb)] 69.4 kg (153 lb) (08/15 1500)  Physical Exam:  General: alert, cooperative and no distress Lochia: appropriate Uterine Fundus: firm Incision: healing well, no significant drainage, no dehiscence, no significant erythema On-Q pump in place, no leaking. DVT Evaluation: No evidence of DVT seen on physical exam. Negative Homan's sign.   Recent Labs  10/15/15 1136 10/17/15 0550  HGB 12.4 11.0*  HCT 35.1* 30.7*    Assessment/Plan: Status post Cesarean section. Doing well postoperatively. Continue On-Q pump until DC. Continue current care.  Leland HerElsia J Yoo 10/17/2015, 7:45 AM  OB FELLOW POSTPARTUM PROGRESS NOTE ATTESTATION  I have seen and examined this patient and agree with above documentation in the resident's note.   Ernestina PennaNicholas Yacine Droz, MD 11:16 AM

## 2015-10-17 NOTE — Progress Notes (Signed)
On-Q pump site assessed. Clean, dry, line unclamped; no leakage noted.

## 2015-10-17 NOTE — Progress Notes (Signed)
Norified T/S (spoke with Thereasa DistanceStacy Diette, student midwife) that patient expelled large yellowish-brown mucus "plug", with moderate amount of bleeding, which stopped,  at fundal check. Blood pressures have been trending low; patient reports no dizziness or weakness and able to walk to bathroom and back. Other vs wnl.   Patient is reporting pain level at "no pain".  No new orders

## 2015-10-17 NOTE — Lactation Note (Signed)
This note was copied from a baby's chart. Lactation Consultation Note  Patient Name: Samantha Suarez ZOXWR'UToday's Date: 10/17/2015 Reason for consult: Initial assessment Mom is experienced breastfeeding her first baby.  She states newborn is feeding well.  Instructed to watch for cues and feed baby anytime cues are seen.  Encouraged to call for assist/concerns.  Breastfeeding consultation services and support information given and reviewed.  Mom very tired and unable to stay awake during visit.  Maternal Data    Feeding    LATCH Score/Interventions                      Lactation Tools Discussed/Used     Consult Status Consult Status: Follow-up Date: 10/18/15 Follow-up type: In-patient    Huston FoleyMOULDEN, [name] 10/17/2015, 12:09 PM

## 2015-10-18 ENCOUNTER — Encounter: Payer: BLUE CROSS/BLUE SHIELD | Admitting: Family Medicine

## 2015-10-18 ENCOUNTER — Ambulatory Visit (HOSPITAL_COMMUNITY)
Admission: RE | Admit: 2015-10-18 | Payer: BLUE CROSS/BLUE SHIELD | Source: Ambulatory Visit | Admitting: Family Medicine

## 2015-10-18 MED ORDER — IBUPROFEN 600 MG PO TABS: 600.0000 mg | ORAL_TABLET | Freq: Four times a day (QID) | ORAL | 0 refills | Status: DC | PRN

## 2015-10-18 MED ORDER — MEASLES, MUMPS & RUBELLA VAC ~~LOC~~ INJ
0.5000 mL | INJECTION | Freq: Once | SUBCUTANEOUS | Status: DC
  Filled 2015-10-18: qty 0.5

## 2015-10-18 MED ORDER — OXYCODONE-ACETAMINOPHEN 5-325 MG PO TABS
1.0000 | ORAL_TABLET | ORAL | 0 refills | Status: DC | PRN
Start: 2015-10-18 — End: 2015-11-23

## 2015-10-18 MED FILL — Bupivacaine HCl Preservative Free (PF) Inj 0.5%: INTRAMUSCULAR | Qty: 300 | Status: AC

## 2015-10-18 NOTE — Lactation Note (Signed)
This note was copied from a baby's chart. Lactation Consultation Note Went back to assist in positioning and gave gels. Baby had good latch, assisted w/positioning and props. LC feels that baby wasn't close enough which made a pulling effect causing pain and raw area in center of Lt. Nipple. Has good colostrum flow. Encouraged to use BM as well for sore nipples and air dry.  Patient Name: Samantha Suarez DroneFabiola Starkes WUJWJ'XToday's Date: 10/18/2015 Reason for consult: Initial assessment;Breast/nipple pain   Maternal Data    Feeding Feeding Type: Breast Fed Length of feed: 5 min (still BF)  LATCH Score/Interventions Latch: Grasps breast easily, tongue down, lips flanged, rhythmical sucking. Intervention(s): Adjust position;Assist with latch;Breast massage  Audible Swallowing: Spontaneous and intermittent Intervention(s): Hand expression  Type of Nipple: Everted at rest and after stimulation  Comfort (Breast/Nipple): Filling, red/small blisters or bruises, mild/mod discomfort  Problem noted: Mild/Moderate discomfort;Cracked, bleeding, blisters, bruises Interventions  (Cracked/bleeding/bruising/blister): Expressed breast milk to nipple Interventions (Mild/moderate discomfort): Hand massage;Hand expression;Comfort gels  Hold (Positioning): Assistance needed to correctly position infant at breast and maintain latch. Intervention(s): Support Pillows;Position options  LATCH Score: 8  Lactation Tools Discussed/Used Tools: Comfort gels   Consult Status Consult Status: PRN Date: 10/18/15 Follow-up type: In-patient    Charyl DancerCARVER, Pretty Weltman G 10/18/2015, 6:39 AM

## 2015-10-18 NOTE — Lactation Note (Signed)
This note was copied from a baby's chart. Lactation Consultation Note Mom BF in cradle position when entered rm. Baby cluster feeding. Mom states her nipples are very sore. Has coconut oil, unable to use much d/t cluster feeding. Comfort gels given, explained not to wear using coconut oil or any other type of ointment to nipples. Mom stated she is unable to BF in football position. Wearing bra. Baby had minimal output. ? Transfer. Mom's breast feel slightly heavy. The breast mom is BF on is softer than opposite breast. Demonstrated breast massage during BF to aide in transfer. Encouraged I&O and importance.  Patient Name: Samantha Hale DroneFabiola Cliff WJXBJ'YToday's Date: 10/18/2015 Reason for consult: Follow-up assessment;Infant weight loss   Maternal Data    Feeding Feeding Type: Breast Fed Length of feed: 25 min  LATCH Score/Interventions Latch: Grasps breast easily, tongue down, lips flanged, rhythmical sucking.  Audible Swallowing: Spontaneous and intermittent Intervention(s): Hand expression;Alternate breast massage  Type of Nipple: Everted at rest and after stimulation  Comfort (Breast/Nipple): Filling, red/small blisters or bruises, mild/mod discomfort  Problem noted: Mild/Moderate discomfort;Cracked, bleeding, blisters, bruises Interventions  (Cracked/bleeding/bruising/blister): Expressed breast milk to nipple Interventions (Mild/moderate discomfort): Comfort gels;Hand massage;Hand expression  Hold (Positioning): No assistance needed to correctly position infant at breast. Intervention(s): Breastfeeding basics reviewed;Support Pillows;Position options;Skin to skin  LATCH Score: 9  Lactation Tools Discussed/Used     Consult Status Consult Status: Follow-up Date: 10/19/15 Follow-up type: In-patient    Charyl DancerCARVER, Paighton Godette G 10/18/2015, 6:29 AM

## 2015-10-18 NOTE — Discharge Instructions (Signed)

## 2015-10-18 NOTE — Progress Notes (Signed)
Q pump removed for discharge after pump complete. Pt has had mild pain around a "2" on the pain scale at the incision so 1 percocet was given prior to d/c. The q pump was removed with a small amount, 1cc serous leakage, catheter intact with brown tip seen. The q-pump tegraderm was under the honeycomb dressing which required applying a new dressing and three steris that came off with the dressing.  The bandaid applied to the Q-pump site was covered by the new Honeycomb dressing the pt. Will be removing at home. Site CDI. Samantha PatriciaLisa Niralya Ohanian, RN

## 2015-10-18 NOTE — Lactation Note (Signed)
This note was copied from a baby's chart. Lactation Consultation Note  Patient Name: Samantha Suarez UEAVW'UToday's Date: 10/18/2015 Reason forHale Suarez consult: Follow-up assessment   Reviewed all Bf information in Taking Care of Baby and Me Booklet. Reviewed Engorgement prevention/treatment with mom. Reviewed I/O and enc mom to maintain feeding log and take to ped appt. [facility] Brochure reviewed, mom aware of OP Services, BF Support Groups and LC phone #. Mom without questions/concerns at this time. To call prn.    Maternal Data Formula Feeding for Exclusion: No Has patient been taught Hand Expression?: Yes Does the patient have breastfeeding experience prior to this delivery?: Yes  Feeding Feeding Type: Breast Fed  LATCH Score/Interventions Latch: Grasps breast easily, tongue down, lips flanged, rhythmical sucking.  Audible Swallowing: Spontaneous and intermittent  Type of Nipple: Everted at rest and after stimulation  Comfort (Breast/Nipple): Filling, red/small blisters or bruises, mild/mod discomfort  Problem noted: Mild/Moderate discomfort Interventions  (Cracked/bleeding/bruising/blister): Expressed breast milk to nipple;Hand pump Interventions (Mild/moderate discomfort): Hand massage;Comfort gels;Hand expression  Hold (Positioning): No assistance needed to correctly position infant at breast.  LATCH Score: 9  Lactation Tools Discussed/Used     Consult Status Consult Status: Complete Follow-up type: Call as needed    Ed BlalockSharon S Hice 10/18/2015, 12:01 PM

## 2015-10-18 NOTE — Discharge Summary (Signed)
OB Discharge Summary     Patient Name: Samantha Suarez DOB: [birth year] MRN: [id number]  Date of admission: 10/16/2015 Delivering MD: Truett Mainland   Date of discharge: 10/18/2015  Admitting diagnosis: PREV CS Intrauterine pregnancy: [redacted]w[redacted]d    Secondary diagnosis:  Active Problems:   Status post cesarean section  Additional problems: A1GDM; rubella nonimmune     Discharge diagnosis: Term Pregnancy Delivered                                                                                                Post partum procedures:MMR ordered prior to d/c  Augmentation: N/A  Complications: None  Hospital course:  Sceduled C/S   30y.o. yo [id number] admitted to the hospital 10/16/2015 for scheduled cesarean section with the following indication:Elective Repeat.  Membrane Rupture Time/Date: 9:58 AM ,10/16/2015   Patient delivered a Viable infant.10/16/2015  Details of operation can be found in separate operative note.  Pateint had an uncomplicated postpartum course.  She is ambulating, tolerating a regular diet, passing flatus, and urinating well. Patient is discharged home in stable condition on  10/18/15          Physical exam Vitals:   10/17/15 0651 10/17/15 1115 10/17/15 1717 10/18/15 0525  BP: (!) 99/55 (!) 93/59 126/78 104/67  Pulse: 60 (!) 58 62 67  Resp: _0 Temp: 98.1 F (36.7 C) 97.5 F (36.4 C) 97.5 F (36.4 C) 98.3 F (36.8 C)  TempSrc:  Oral Oral Oral  SpO2: 99%     Weight:      Height:       General: alert and cooperative Lochia: appropriate Uterine Fundus: firm Incision: Dressing is clean, dry, and intact; On Q pump intact DVT Evaluation: No evidence of DVT seen on physical exam. Labs: Lab Results  Component Value Date   WBC 13.5 (H) 10/17/2015   HGB 11.0 (L) 10/17/2015   HCT 30.7 (L) 10/17/2015   MCV 87.5 10/17/2015   PLT 168 10/17/2015   CMP Latest Ref Rng & Units 10/15/2015  Glucose 65 - 99 mg/dL 78  BUN 6 - 20 mg/dL 16   Creatinine 0.44 - 1.00 mg/dL 0.51  Sodium 135 - 145 mmol/L 133(L)  Potassium 3.5 - 5.1 mmol/L 3.8  Chloride 101 - 111 mmol/L 108  CO2 22 - 32 mmol/L 18(L)  Calcium 8.9 - 10.3 mg/dL 8.7(L)    Discharge instruction: per After Visit Summary and "Baby and Me Booklet".  After visit meds:    Medication List    STOP taking these medications   ACCU-CHEK AVIVA PLUS w/Device Kit   BAYER MICROLET LANCETS lancets   glucose blood test strip Commonly known as:  ACCU-CHEK AVIVA     TAKE these medications   ibuprofen 600 MG tablet Commonly known as:  ADVIL,MOTRIN Take 1 tablet (600 mg total) by mouth every 6 (six) hours as needed for mild pain.   oxyCODONE-acetaminophen 5-325 MG tablet Commonly known as:  PERCOCET/ROXICET Take 1 tablet by mouth every 4 (four) hours as needed (pain scale 4-7).   prenatal multivitamin  Tabs tablet Take 1 tablet by mouth daily at 12 noon.       Diet: routine diet  Activity: Advance as tolerated. Pelvic rest for 6 weeks.   Outpatient follow up:6 weeks; needs 2hr glucola at [facility] visit Follow up Appt:No future appointments. Follow up Visit:No Follow-up on file.  Postpartum contraception: IUD Mirena  Newborn Data: Live born female  Birth Weight: 7 lb 13 oz (3545 g) APGAR: 9, 10  Baby Feeding: Breast Disposition:home with mother- for circ prior to d/c   10/18/2015 Serita Grammes, CNM  9:24 AM

## 2015-10-24 ENCOUNTER — Encounter: Payer: Self-pay | Admitting: *Deleted

## 2015-11-02 ENCOUNTER — Telehealth: Payer: Self-pay | Admitting: *Deleted

## 2015-11-02 NOTE — Telephone Encounter (Signed)
Received a voice message from [place] at [employer] Insurance company- did not understand the name of company . States they received an attending physician statement with [employer]. States they need to verify actual delivery date and type of delivery.

## 2015-11-02 NOTE — Telephone Encounter (Signed)
Called  [employer] able insurance company and confirmed date of delivery and type as requested after verifying release of information was signed.

## 2015-11-21 ENCOUNTER — Ambulatory Visit (INDEPENDENT_AMBULATORY_CARE_PROVIDER_SITE_OTHER): Payer: BLUE CROSS/BLUE SHIELD | Admitting: Family Medicine

## 2015-11-21 DIAGNOSIS — B354 Tinea corporis: Secondary | ICD-10-CM | POA: Diagnosis not present

## 2015-11-21 MED ORDER — NYSTATIN 100000 UNIT/GM EX OINT: TOPICAL_OINTMENT | Freq: Two times a day (BID) | CUTANEOUS | Status: DC

## 2015-11-21 NOTE — Progress Notes (Signed)
   Subjective:    Patient ID: Samantha Suarez is a 30 y.o. female presenting with No chief complaint on file.  on 11/21/2015  HPI: Here for incision check. Has pp check scheduled for Friday. Noted fluid and rash associated with C-section incision.  Review of Systems  Constitutional: Negative for chills and fever.  Respiratory: Negative for shortness of breath.   Cardiovascular: Negative for chest pain.  Gastrointestinal: Negative for abdominal pain, nausea and vomiting.  Genitourinary: Negative for dysuria.  Skin: Negative for rash.      Objective:    There were no vitals taken for this visit. Physical Exam  Constitutional: She is oriented to person, place, and time. She appears well-developed and well-nourished. No distress.  HENT:  Head: Normocephalic and atraumatic.  Eyes: No scleral icterus.  Neck: Neck supple.  Cardiovascular: Normal rate.   Pulmonary/Chest: Effort normal.  Abdominal: Soft.  Neurological: She is alert and oriented to person, place, and time.  Skin:  Left edge of incision with minimal erythema. Odor consistent with yeast. Several small open areas treated with AgNO3.  Psychiatric: She has a normal mood and affect.        Assessment & Plan:   Tinea corporis - treat with nystatin--keep area clean and dry - Plan: nystatin ointment (MYCOSTATIN)   Total face-to-face time with patient: 15 minutes. Over 50% of encounter was spent on counseling and coordination of care. Return for pp check.  Reva Boresanya S Theotis Gerdeman 11/21/2015 5:24 PM

## 2015-11-21 NOTE — Patient Instructions (Signed)

## 2015-11-23 ENCOUNTER — Encounter: Payer: Self-pay | Admitting: Family Medicine

## 2015-11-23 ENCOUNTER — Ambulatory Visit (INDEPENDENT_AMBULATORY_CARE_PROVIDER_SITE_OTHER): Payer: BLUE CROSS/BLUE SHIELD | Admitting: Family Medicine

## 2015-11-23 DIAGNOSIS — Z3202 Encounter for pregnancy test, result negative: Secondary | ICD-10-CM | POA: Diagnosis not present

## 2015-11-23 DIAGNOSIS — B354 Tinea corporis: Secondary | ICD-10-CM

## 2015-11-23 DIAGNOSIS — Z30430 Encounter for insertion of intrauterine contraceptive device: Secondary | ICD-10-CM | POA: Diagnosis not present

## 2015-11-23 LAB — POCT PREGNANCY, URINE: PREG TEST UR: NEGATIVE

## 2015-11-23 MED ORDER — LEVONORGESTREL 18.6 MCG/DAY IU IUD
INTRAUTERINE_SYSTEM | Freq: Once | INTRAUTERINE | Status: AC
  Administered 2015-11-23: 1 via INTRAUTERINE

## 2015-11-23 MED ORDER — NYSTATIN 100000 UNIT/GM EX CREA: 1.0000 "application " | TOPICAL_CREAM | Freq: Two times a day (BID) | CUTANEOUS | 0 refills | Status: DC

## 2015-11-23 NOTE — Progress Notes (Signed)
Subjective:     Samantha Suarez is a 30 y.o. female who presents for a postpartum visit. She is 5 weeks postpartum following a low cervical transverse Cesarean section. I have fully reviewed the prenatal and intrapartum course. The delivery was at 39 gestational weeks. Outcome: repeat cesarean section, low transverse incision. Anesthesia: spinal. Postpartum course has been complicated with fungal infection of incision. Was seen 3 days ago.  Has not picked up her nystatin cream yet.  Pain less. Baby's course has been normal. Baby is feeding by breast. Bleeding staining only. Bowel function is abnormal: some constipation . Bladder function is normal. Patient is not sexually active. Contraception method is IUD. Postpartum depression screening: negative.  The following portions of the patient's history were reviewed and updated as appropriate: allergies, current medications, past family history, past medical history, past social history, past surgical history and problem list.  Review of Systems Pertinent items noted in HPI and remainder of comprehensive ROS otherwise negative.   Objective:    BP 114/75   Pulse 61   Wt 131 lb 8 oz (59.6 kg)   Breastfeeding? Yes   BMI 26.56 kg/m   General:  alert, cooperative and no distress  Lungs: clear to auscultation bilaterally  Heart:  regular rate and rhythm, S1, S2 normal, no murmur, click, rub or gallop  Abdomen: soft, non-tender; bowel sounds normal; no masses,  no organomegaly. Incision has mild erythema.  Small open areas.    Vulva:  normal  Vagina: normal vagina, no discharge, exudate, lesion, or erythema  Cervix:  retroverted        Assessment:     Normal postpartum exam. Pap smear not done at today's visit.   Plan:    1. Contraception: IUD - placed today 2. Follow up in: 1 week for wound recheck and 2hrGTT.  1 month for IUD.      IUD Procedure Note Patient identified, informed consent performed, signed copy in chart, time out was  performed.  Urine pregnancy test negative.  Speculum placed in the vagina.  Cervix visualized.  Cleaned with Betadine x 2.  Grasped anteriorly with a single tooth tenaculum.  Uterus sounded to 7 cm.  Liletta  IUD placed per manufacturer's recommendations.  Strings trimmed to 3 cm. Tenaculum was removed, good hemostasis noted.  Patient tolerated procedure well.   Patient given post procedure instructions and Liletta care card with expiration date.  Patient is asked to check IUD strings periodically and follow up in 4-6 weeks for IUD check.

## 2015-11-23 NOTE — Progress Notes (Signed)
Pt states that her pharmacy did not receive her nystatin prescription. Contacted [facility] they informed me the same. Reordered Rx.

## 2015-11-23 NOTE — Patient Instructions (Signed)

## 2015-11-30 ENCOUNTER — Other Ambulatory Visit: Payer: BLUE CROSS/BLUE SHIELD

## 2015-11-30 VITALS — BP 108/70 | HR 73

## 2015-11-30 DIAGNOSIS — O24415 Gestational diabetes mellitus in pregnancy, controlled by oral hypoglycemic drugs: Secondary | ICD-10-CM

## 2015-11-30 NOTE — Progress Notes (Addendum)
Patient presented to office today for wound check and 2 hour gtt. Wound looks to be healing well. Patient was given cream to help with irritation around her scar. Patient advised to continued using cream on her incision.

## 2015-11-30 NOTE — Addendum Note (Signed)
Addended by: Cheree DittoGRAHAM, Bahar Shelden A on: 11/30/2015 10:13 AM   Modules accepted: Orders

## 2015-12-01 LAB — GLUCOSE TOLERANCE, 2 HOURS
GLUCOSE, 2 HOUR: 62 mg/dL — AB (ref <140)
GLUCOSE, FASTING: 71 mg/dL (ref 65–99)

## 2015-12-04 ENCOUNTER — Encounter: Payer: Self-pay | Admitting: Family Medicine

## 2015-12-04 DIAGNOSIS — Z8632 Personal history of gestational diabetes: Secondary | ICD-10-CM | POA: Insufficient documentation

## 2015-12-21 ENCOUNTER — Ambulatory Visit (INDEPENDENT_AMBULATORY_CARE_PROVIDER_SITE_OTHER): Payer: BLUE CROSS/BLUE SHIELD | Admitting: Family Medicine

## 2015-12-21 VITALS — BP 108/67 | HR 66 | Wt 129.5 lb

## 2015-12-21 DIAGNOSIS — Z30431 Encounter for routine checking of intrauterine contraceptive device: Secondary | ICD-10-CM | POA: Diagnosis not present

## 2015-12-21 NOTE — Progress Notes (Signed)
   Subjective:   Patient Name: Samantha Suarez, female   DOB: 1986-02-21, 30 y.o.  MRN: [id number]  HPI Patient here for an IUD check.  She had the Lilietta IUD placed 1 month ago.  She reports some discomfort from the strings. Also having some intermittent spotting.   Review of Systems  Constitutional: Negative for fever and chills.  Gastrointestinal: Negative for abdominal pain.  Genitourinary: Negative for vaginal discharge, vaginal pain, pelvic pain and dyspareunia.       Objective:   Physical Exam  Constitutional: She appears well-developed and well-nourished.  HENT:  Head: Normocephalic and atraumatic.  Abdominal: Soft. There is no tenderness. There is no guarding.  Genitourinary: There is no rash, tenderness or lesion on the right labia. There is no rash, tenderness or lesion on the left labia. No erythema or tenderness in the vagina. No foreign body around the vagina. No signs of injury around the vagina. No vaginal discharge found.    Skin: Skin is warm and dry.  Psychiatric: She has a normal mood and affect. Her behavior is normal. Judgment and thought content normal.      Assessment & Plan:  1. IUD check up IUD in place. Strings trimmed. Offered progesterone for 2-3 weeks to stop bleeding - pt prefers to wait. Pt to call with any other problems.  Recheck in 1 year.

## 2016-02-01 ENCOUNTER — Ambulatory Visit (INDEPENDENT_AMBULATORY_CARE_PROVIDER_SITE_OTHER): Payer: Self-pay | Admitting: Physician Assistant

## 2016-02-01 VITALS — BP 110/64 | HR 84 | Temp 98.1°F | Ht 59.0 in | Wt 127.2 lb

## 2016-02-01 DIAGNOSIS — J029 Acute pharyngitis, unspecified: Secondary | ICD-10-CM

## 2016-02-01 DIAGNOSIS — J069 Acute upper respiratory infection, unspecified: Secondary | ICD-10-CM

## 2016-02-01 DIAGNOSIS — B9789 Other viral agents as the cause of diseases classified elsewhere: Secondary | ICD-10-CM

## 2016-02-01 LAB — POCT RAPID STREP A (OFFICE): RAPID STREP A SCREEN: NEGATIVE

## 2016-02-01 MED ORDER — HYDROCODONE-HOMATROPINE 5-1.5 MG/5ML PO SYRP: 5.0000 mL | ORAL_SOLUTION | Freq: Three times a day (TID) | ORAL | 0 refills | Status: DC | PRN

## 2016-02-01 MED ORDER — IPRATROPIUM BROMIDE 0.06 % NA SOLN: 2.0000 | Freq: Three times a day (TID) | NASAL | 0 refills | Status: DC

## 2016-02-01 MED ORDER — GUAIFENESIN ER 1200 MG PO TB12: 1.0000 | ORAL_TABLET | Freq: Two times a day (BID) | ORAL | 0 refills | Status: AC

## 2016-02-01 NOTE — Progress Notes (Signed)
   Hale DroneFabiola Busbin  MRN: [id number] DOB: [birth year]  Subjective:  Pt presents to clinic with cold symptoms that started 5 days ago with a sore throat - she now has sinus congestion with yellow/green nasal drainage.  She has no PND - She has a cough that is worse at night - it is dry at night and productive in the am with the same color as her nasal drainage.  She has had some fevers and chills.    Using cold and flu medications OTC - No sick contacts - child did have eye stuff last week but it has resolved  Breastfeeding a 793 month old  Review of Systems  Constitutional: Positive for chills and fever.  HENT: Positive for congestion, postnasal drip, rhinorrhea and sore throat.   Eyes: Positive for discharge (white - matted in the am) and redness.  Respiratory: Positive for cough. Negative for shortness of breath and wheezing.        No asthma, nonsmoker  Gastrointestinal: Negative.   Neurological: Positive for headaches.    Patient Active Problem List   Diagnosis Date Noted  . History of gestational diabetes 12/04/2015  . Tinea corporis 11/21/2015  . Previous cesarean delivery affecting pregnancy, antepartum 08/07/2015  . Atypical squamous cell changes of undetermined significance (ASCUS) on cervical cytology with positive high risk human papilloma virus (HPV) 07/19/2013    No current outpatient prescriptions on file prior to visit.   No current facility-administered medications on file prior to visit.     No Known Allergies  Pt patients past, family and social history were reviewed and updated.   Objective:  BP 110/64 (BP Location: Right Arm, Patient Position: Sitting, Cuff Size: Small)   Pulse 84   Temp 98.1 F (36.7 C) (Oral)   Ht 4\' 11"  (1.499 m)   Wt 127 lb 3.2 oz (57.7 kg)   SpO2 98%   Breastfeeding? Yes   BMI 25.69 kg/m   Physical Exam  Constitutional: She is oriented to person, place, and time and well-developed, well-nourished, and in no distress.  HENT:    Head: Normocephalic and atraumatic.  Right Ear: Hearing, tympanic membrane, external ear and ear canal normal.  Left Ear: Hearing, tympanic membrane, external ear and ear canal normal.  Nose: Mucosal edema (red) present.  Mouth/Throat: Uvula is midline, oropharynx is clear and moist and mucous membranes are normal.  Eyes: Conjunctivae are normal.  Neck: Normal range of motion.  Cardiovascular: Normal rate, regular rhythm and normal heart sounds.   No murmur heard. Pulmonary/Chest: Effort normal and breath sounds normal.  Neurological: She is alert and oriented to person, place, and time. Gait normal.  Skin: Skin is warm and dry.  Psychiatric: Mood, memory, affect and judgment normal.  Vitals reviewed.  Results for orders placed or performed in visit on 02/01/16  POCT rapid strep A  Result Value Ref Range   Rapid Strep A Screen Negative Negative     Assessment and Plan :  Sore throat - Plan: POCT rapid strep A  Viral URI with cough - Plan: Guaifenesin (MUCINEX MAXIMUM STRENGTH) 1200 MG TB12, ipratropium (ATROVENT) 0.06 % nasal spray, HYDROcodone-homatropine (HYCODAN) 5-1.5 MG/5ML syrup  Symptomatic care d/w pt  Benny LennertSarah Amauris Debois PA-C  [facility] 02/01/2016 11:35 AM

## 2016-02-01 NOTE — Patient Instructions (Addendum)
  Please push fluids.  Tylenol and Motrin for fever and body aches.    A humidifier can help especially when the air is dry -if you do not have a humidifier you can boil a pot of water on the stove in your home to help with the dry air.    IF you received an x-ray today, you will receive an invoice from [facility]. Please contact [facility] at [phone] with questions or concerns regarding your invoice.   IF you received labwork today, you will receive an invoice from [facility]. Please contact [facility] at [phone] with questions or concerns regarding your invoice.   Our billing staff will not be able to assist you with questions regarding bills from these companies.  You will be contacted with the lab results as soon as they are available. The fastest way to get your results is to activate your My Chart account. Instructions are located on the last page of this paperwork. If you have not heard from us regarding the results in 2 weeks, please contact this office.      

## 2016-03-21 ENCOUNTER — Ambulatory Visit: Payer: BLUE CROSS/BLUE SHIELD | Admitting: Family Medicine

## 2016-04-11 ENCOUNTER — Ambulatory Visit: Payer: BLUE CROSS/BLUE SHIELD | Admitting: Family Medicine

## 2016-05-09 ENCOUNTER — Encounter: Payer: Self-pay | Admitting: Obstetrics and Gynecology

## 2016-05-09 ENCOUNTER — Ambulatory Visit (INDEPENDENT_AMBULATORY_CARE_PROVIDER_SITE_OTHER): Payer: BLUE CROSS/BLUE SHIELD | Admitting: Obstetrics and Gynecology

## 2016-05-09 VITALS — BP 91/61 | HR 76 | Wt 124.5 lb

## 2016-05-09 DIAGNOSIS — Z30431 Encounter for routine checking of intrauterine contraceptive device: Secondary | ICD-10-CM

## 2016-05-09 DIAGNOSIS — R300 Dysuria: Secondary | ICD-10-CM

## 2016-05-09 LAB — POCT URINALYSIS DIP (DEVICE)
BILIRUBIN URINE: NEGATIVE
Glucose, UA: NEGATIVE mg/dL
KETONES UR: NEGATIVE mg/dL
NITRITE: NEGATIVE
PH: 5.5 (ref 5.0–8.0)
Protein, ur: NEGATIVE mg/dL
Specific Gravity, Urine: 1.025 (ref 1.005–1.030)
Urobilinogen, UA: 0.2 mg/dL (ref 0.0–1.0)

## 2016-05-09 NOTE — Progress Notes (Signed)
Obstetrics and Gynecology Return Patient Evaluation  Appointment Date: 05/09/2016  [facility]  Primary Care Provider: No PCP Per Patient   Chief Complaint:  Chief Complaint  Patient presents with  . Contraception    iud check    History of Present Illness: Samantha Suarez is a 31 y.o. Hispanic [id number] (Patient's last menstrual period was 05/07/2016.), seen for the above chief complaint.   In January, patient didn't feel strings (she checks the strings regularly just to be sure everything is okay) and didn't feel anything, so she called for an appt but the snow days caused her to reschedule. She has felt the strings since. She has regular qmonth periods that aren't painful or heavy and no abdominal pain or pain with intercourse. Occasional burning with urination.    Review of Systems: as noted in the History of Present Illness.  Past Medical History:  Past Medical History:  Diagnosis Date  . Abnormal Pap smear of cervix 05/13/2013   ASC-US +HPV  . Gestational diabetes     Past Surgical History:  Past Surgical History:  Procedure Laterality Date  . CESAREAN SECTION N/A 10/12/2012   Procedure: CESAREAN SECTION;  Surgeon: Mickel Baasichard D Kaplan, MD;  Location: [facility];  Service: Obstetrics;  Laterality: N/A;  . CESAREAN SECTION N/A 10/16/2015   Procedure: CESAREAN SECTION;  Surgeon: Levie HeritageJacob J Stinson, DO;  [facility];  Service: Obstetrics;  Laterality: N/A;  . NO PAST SURGERIES      Past Obstetrical History:  OB History  Gravida Para Term Preterm AB Living  3 2 2  0 1 2  SAB TAB Ectopic Multiple Live Births  1 0 0 0 2    # Outcome Date GA Lbr Len/2nd Weight Sex Delivery Anes PTL Lv  3 Term 10/16/15 6655w4d  7 lb 13 oz (3.545 kg) M CS-LTranv Spinal  LIV  2 Term 10/12/12 1541w3d 07:33 / 04:17 7 lb 6.5 oz (3.36 kg) F CS-LTranv EPI  LIV  1 SAB 05/2011              Past Gynecological History: As per HPI. Pap negative 05/2015  Social  History:  Social History   Social History  . Marital status: Single    Spouse name: N/A  . Number of children: N/A  . Years of education: N/A   Occupational History  . Not on file.   Social History Main Topics  . Smoking status: Never Smoker  . Smokeless tobacco: Never Used  . Alcohol use No  . Drug use: No  . Sexual activity: Yes    Birth control/ protection: Condom   Other Topics Concern  . Not on file   Social History Narrative   ** Merged History Encounter **        Family History:  Family History  Problem Relation Age of Onset  . Hyperlipidemia Mother     Medications Samantha Suarez had no medications administered during this visit. Current Outpatient Prescriptions  Medication Sig Dispense Refill  . HYDROcodone-homatropine (HYCODAN) 5-1.5 MG/5ML syrup Take 5 mLs by mouth every 8 (eight) hours as needed for cough. (Patient not taking: Reported on 05/09/2016) 120 mL 0  . ipratropium (ATROVENT) 0.06 % nasal spray Place 2 sprays into the nose 3 (three) times daily. (Patient not taking: Reported on 05/09/2016) 15 mL 0   No current facility-administered medications for this visit.     Allergies Patient has no known allergies.   Physical Exam:  BP 91/61  Pulse 76   Wt 124 lb 8 oz (56.5 kg)   LMP 05/07/2016   Breastfeeding? No   BMI 25.15 kg/m  Body mass index is 25.15 kg/m.   General appearance: Well nourished, well developed female in no acute distress.  Neck:  Supple, normal appearance, and no thyromegaly  Cardiovascular: normal s1 and s2.  No murmurs, rubs or gallops. Respiratory:  Clear to auscultation bilateral. Normal respiratory effort Abdomen: positive bowel sounds and no masses, hernias; diffusely non tender to palpation, non distended Neuro/Psych:  Normal mood and affect.  Skin:  Warm and dry.  Lymphatic:  No inguinal lymphadenopathy.   Pelvic exam: is not limited by body habitus EGBUS: within normal limits, Vagina: within normal limits and with  minimal old blood in the vault, Cervix: normal appearing cervix without tenderness, discharge or lesions. Strings seen coming out the os and tucked into the fornices. Uterus:  nonenlarged and non tender and Adnexa:  normal adnexa and no mass, fullness, tenderness Rectovaginal: deferred  Laboratory:  Results for Samantha Suarez, Samantha Suarez (MRN [id number]) as of 05/09/2016 10:12  Ref. Range 05/09/2016 09:15  Bilirubin Urine Latest Ref Range: NEGATIVE  NEGATIVE  Glucose Latest Ref Range: NEGATIVE mg/dL NEGATIVE  Hgb urine dipstick Latest Ref Range: NEGATIVE  MODERATE (A)  Ketones, ur Latest Ref Range: NEGATIVE mg/dL NEGATIVE  Leukocytes, UA Latest Ref Range: NEGATIVE  MODERATE (A)  Nitrite Latest Ref Range: NEGATIVE  NEGATIVE  pH Latest Ref Range: 5.0 - 8.0  5.5  Protein Latest Ref Range: NEGATIVE mg/dL NEGATIVE  Specific Gravity, Urine Latest Ref Range: 1.005 - 1.030  1.025  Urobilinogen, UA Latest Ref Range: 0.0 - 1.0 mg/dL 0.2    Radiology: none  Assessment: pt doing well  Plan:  Routine care. poc u/a negative.  RTC PRN  Cornelia Copa MD Attending [facility] [employer] [facility])

## 2016-05-09 NOTE — Progress Notes (Signed)
Pt c/o burning and feeling of pressure with urination. Obtaining UA.

## 2016-10-25 ENCOUNTER — Inpatient Hospital Stay (HOSPITAL_COMMUNITY)
Admission: AD | Admit: 2016-10-25 | Discharge: 2016-10-25 | Disposition: A | Discharge status: Home / Self Care | Payer: Medicaid Other | Source: Ambulatory Visit | Attending: Obstetrics and Gynecology | Admitting: Obstetrics and Gynecology

## 2016-10-25 ENCOUNTER — Inpatient Hospital Stay (HOSPITAL_COMMUNITY): Payer: Medicaid Other

## 2016-10-25 ENCOUNTER — Encounter (HOSPITAL_COMMUNITY): Payer: Self-pay | Admitting: *Deleted

## 2016-10-25 DIAGNOSIS — R1032 Left lower quadrant pain: Secondary | ICD-10-CM | POA: Insufficient documentation

## 2016-10-25 DIAGNOSIS — Z8632 Personal history of gestational diabetes: Secondary | ICD-10-CM | POA: Insufficient documentation

## 2016-10-25 DIAGNOSIS — Y762 Prosthetic and other implants, materials and accessory obstetric and gynecological devices associated with adverse incidents: Secondary | ICD-10-CM | POA: Diagnosis not present

## 2016-10-25 DIAGNOSIS — T8332XA Displacement of intrauterine contraceptive device, initial encounter: Secondary | ICD-10-CM

## 2016-10-25 DIAGNOSIS — R109 Unspecified abdominal pain: Secondary | ICD-10-CM

## 2016-10-25 DIAGNOSIS — Z30431 Encounter for routine checking of intrauterine contraceptive device: Secondary | ICD-10-CM

## 2016-10-25 LAB — URINALYSIS, ROUTINE W REFLEX MICROSCOPIC
Bacteria, UA: NONE SEEN
Bilirubin Urine: NEGATIVE
GLUCOSE, UA: NEGATIVE mg/dL
Ketones, ur: NEGATIVE mg/dL
Nitrite: NEGATIVE
PROTEIN: 100 mg/dL — AB
Specific Gravity, Urine: 1.018 (ref 1.005–1.030)
pH: 6 (ref 5.0–8.0)

## 2016-10-25 LAB — POCT PREGNANCY, URINE: Preg Test, Ur: NEGATIVE

## 2016-10-25 MED ORDER — IBUPROFEN 600 MG PO TABS: 600.0000 mg | ORAL_TABLET | Freq: Four times a day (QID) | ORAL | 0 refills | Status: DC | PRN

## 2016-10-25 NOTE — MAU Provider Note (Signed)
History     CSN: [id number]  Arrival date and time: 10/25/16 1352   First Provider Initiated Contact with Patient 10/25/16 1415      Chief Complaint  Patient presents with  . Abdominal Pain   HPI  Ms. Samantha Suarez is a 31 y.o. [id number] who presents to MAU today with complaint of LLQ abdominal pain. The patient states that she had IUD placed last September at [facility]. She had a return visit in March because she couldn't feel the strings, but states they were visualized on exam. She hasn't felt the strings since then. She states irregular spotting only since IUD placement. She states pain started ~ 2 weeks ago. She feels it is constant and unchanged. She rates pain at 3-4/10 now. She has been taking Advil with some relief. She denies fever, discharge, bleeding, UTI symptoms, N/V or constipation today. She has had intermittent constipation and loose/watery stools a few days off and on over the last few weeks since onset of symptoms. The last episode was this morning. She is sexually active with one partner and does not use condoms.   OB History    Gravida Para Term Preterm AB Living   3 2 2  0 1 2   SAB TAB Ectopic Multiple Live Births   1 0 0 0 2      Past Medical History:  Diagnosis Date  . Abnormal Pap smear of cervix 05/13/2013   ASC-US +HPV  . Gestational diabetes     Past Surgical History:  Procedure Laterality Date  . CESAREAN SECTION N/A 10/12/2012   Procedure: CESAREAN SECTION;  Surgeon: Mickel Baas, MD;  Location: [facility];  Service: Obstetrics;  Laterality: N/A;  . CESAREAN SECTION N/A 10/16/2015   Procedure: CESAREAN SECTION;  Surgeon: Levie Heritage, DO;  Location: [facility] BIRTHING SUITES;  Service: Obstetrics;  Laterality: N/A;  . NO PAST SURGERIES      Family History  Problem Relation Age of Onset  . Hyperlipidemia Mother     Social History  Substance Use Topics  . Smoking status: Never Smoker  . Smokeless tobacco: Never Used  . Alcohol use Yes     Comment:  occasional    Allergies: No Known Allergies  No prescriptions prior to admission.    Review of Systems  Constitutional: Negative for fever.  Gastrointestinal: Positive for abdominal pain, constipation and diarrhea. Negative for nausea and vomiting.  Genitourinary: Negative for dysuria, frequency, urgency, vaginal bleeding and vaginal discharge.   Physical Exam   Blood pressure 132/87, pulse 81, temperature 98.1 F (36.7 C), temperature source Oral, resp. rate 18, height 4\' 9"  (1.448 m), weight 125 lb (56.7 kg), last menstrual period 10/01/2016, SpO2 98 %, not currently breastfeeding.  Physical Exam  Nursing note and vitals reviewed. Constitutional: She is oriented to person, place, and time. She appears well-developed and well-nourished. No distress.  HENT:  Head: Normocephalic and atraumatic.  Cardiovascular: Normal rate.   Respiratory: Effort normal.  GI: Soft. She exhibits no distension and no mass. There is tenderness (mild tenderness to palpation of the LLQ and LUQ). There is no rebound and no guarding.  Genitourinary: Uterus is not enlarged and not tender. Cervix exhibits no motion tenderness, no discharge and no friability. Right adnexum displays no mass and no tenderness. Left adnexum displays no mass and no tenderness. No bleeding in the vagina. Vaginal discharge (small, mucous) found.    Neurological: She is alert and oriented to person, place, and time.  Skin: Skin is  warm and dry. No erythema.  Psychiatric: She has a normal mood and affect.    Results for orders placed or performed during the hospital encounter of 10/25/16 (from the past 24 hour(s))  Urinalysis, Routine w reflex microscopic     Status: Abnormal   Collection Time: 10/25/16  1:56 PM  Result Value Ref Range   Color, Urine YELLOW YELLOW   APPearance HAZY (A) CLEAR   Specific Gravity, Urine 1.018 1.005 - 1.030   pH 6.0 5.0 - 8.0   Glucose, UA NEGATIVE NEGATIVE mg/dL   Hgb urine dipstick SMALL (A)  NEGATIVE   Bilirubin Urine NEGATIVE NEGATIVE   Ketones, ur NEGATIVE NEGATIVE mg/dL   Protein, ur 295 (A) NEGATIVE mg/dL   Nitrite NEGATIVE NEGATIVE   Leukocytes, UA SMALL (A) NEGATIVE   RBC / HPF 6-30 0 - 5 RBC/hpf   WBC, UA TOO NUMEROUS TO COUNT 0 - 5 WBC/hpf   Bacteria, UA NONE SEEN NONE SEEN   Squamous Epithelial / LPF 0-5 (A) NONE SEEN   Mucus PRESENT   Pregnancy, urine POC     Status: None   Collection Time: 10/25/16  2:04 PM  Result Value Ref Range   Preg Test, Ur NEGATIVE NEGATIVE   US Transvaginal Non-ob  Result Date: 10/25/2016 CLINICAL DATA:  Pelvic pain for 3 weeks.  IUD. EXAM: TRANSABDOMINAL AND TRANSVAGINAL ULTRASOUND OF PELVIS TECHNIQUE: Both transabdominal and transvaginal ultrasound examinations of the pelvis were performed. Transabdominal technique was performed for global imaging of the pelvis including uterus, ovaries, adnexal regions, and pelvic cul-de-sac. It was necessary to proceed with endovaginal exam following the transabdominal exam to visualize the IUD and ovaries. COMPARISON:  None FINDINGS: Uterus Measurements: 8.5 x 5.1 x 6.6 cm. Retroverted. No fibroids or other mass visualized. Endometrium Thickness: Obscured by acoustic shadowing from IUD. The IUD penetrates the myometrium in the anterior corpus to the serosal surface. Right ovary Measurements: 1.6 x 1.4 x 1.5 cm. Normal appearance/no adnexal mass. Left ovary Measurements: 3.9 x 2.4 x 3.1 cm. Normal appearance/no adnexal mass. Other findings No abnormal free fluid. IMPRESSION: Malpositioned IUD, which penetrates into the uterine myometrium in the anterior corpus. Normal appearance of both ovaries. No mass or free fluid identified. Electronically Signed   By: Myles Rosenthal M.D.   On: 10/25/2016 16:42   US Pelvis Complete  Result Date: 10/25/2016 CLINICAL DATA:  Pelvic pain for 3 weeks.  IUD. EXAM: TRANSABDOMINAL AND TRANSVAGINAL ULTRASOUND OF PELVIS TECHNIQUE: Both transabdominal and transvaginal ultrasound  examinations of the pelvis were performed. Transabdominal technique was performed for global imaging of the pelvis including uterus, ovaries, adnexal regions, and pelvic cul-de-sac. It was necessary to proceed with endovaginal exam following the transabdominal exam to visualize the IUD and ovaries. COMPARISON:  None FINDINGS: Uterus Measurements: 8.5 x 5.1 x 6.6 cm. Retroverted. No fibroids or other mass visualized. Endometrium Thickness: Obscured by acoustic shadowing from IUD. The IUD penetrates the myometrium in the anterior corpus to the serosal surface. Right ovary Measurements: 1.6 x 1.4 x 1.5 cm. Normal appearance/no adnexal mass. Left ovary Measurements: 3.9 x 2.4 x 3.1 cm. Normal appearance/no adnexal mass. Other findings No abnormal free fluid. IMPRESSION: Malpositioned IUD, which penetrates into the uterine myometrium in the anterior corpus. Normal appearance of both ovaries. No mass or free fluid identified. Electronically Signed   By: Myles Rosenthal M.D.   On: 10/25/2016 16:42    MAU Course  Procedures None  MDM UPT - negative  UA today  Patient declines STD  testing Unable to visualize IUD strings on exam, [place] ordered.  Patient declines pain medication at this time.  Discussed patient with Dr. Vergie Living. Since patient is stable and pain is minimal, follow-up in the office this week for attempted IUD removal, if not possible will schedule for surgical removal.   Assessment and Plan  A: Malpositioned IUD Abdominal pain   P: Discharge home Rx for Ibuprofen given to patient  Patient advised to follow-up with [facility] at 1:40 pm on Tuesday, August 28th for attempted IUD removal  Patient may return to MAU as needed or if her condition were to change or worsen   Vonzella Nipple, PA-C 10/25/2016, 7:01 PM

## 2016-10-25 NOTE — Discharge Instructions (Signed)

## 2016-10-25 NOTE — MAU Note (Signed)
Patient has been having constant abdominal pain for past 3 weeks that is unrelieved with NSAIDS.  Pt had IUD placed last September and has occasional spotting with last time occurring 3 weeks ago.  Denies other vaginal discharge or pain.

## 2016-10-26 LAB — URINE CULTURE

## 2016-10-28 ENCOUNTER — Encounter: Payer: Self-pay | Admitting: Obstetrics and Gynecology

## 2016-10-28 ENCOUNTER — Ambulatory Visit (INDEPENDENT_AMBULATORY_CARE_PROVIDER_SITE_OTHER): Payer: Medicaid Other | Admitting: Obstetrics and Gynecology

## 2016-10-28 VITALS — BP 110/69 | HR 77 | Ht <= 58 in | Wt 129.1 lb

## 2016-10-28 DIAGNOSIS — R102 Pelvic and perineal pain: Secondary | ICD-10-CM

## 2016-10-28 DIAGNOSIS — Z30432 Encounter for removal of intrauterine contraceptive device: Secondary | ICD-10-CM | POA: Diagnosis not present

## 2016-10-28 DIAGNOSIS — T8332XA Displacement of intrauterine contraceptive device, initial encounter: Secondary | ICD-10-CM | POA: Insufficient documentation

## 2016-10-28 DIAGNOSIS — T8332XD Displacement of intrauterine contraceptive device, subsequent encounter: Secondary | ICD-10-CM

## 2016-10-28 NOTE — Patient Instructions (Signed)
Hysteroscopy  Hysteroscopy is a procedure used for looking inside the womb (uterus). It may be done for various reasons, including:  · To evaluate abnormal bleeding, fibroid (benign, noncancerous) tumors, polyps, scar tissue (adhesions), and possibly cancer of the uterus.  · To look for lumps (tumors) and other uterine growths.  · To look for causes of why a woman cannot get pregnant (infertility), causes of recurrent loss of pregnancy (miscarriages), or a lost intrauterine device (IUD).  · To perform a sterilization by blocking the fallopian tubes from inside the uterus.    In this procedure, a thin, flexible tube with a tiny light and camera on the end of it (hysteroscope) is used to look inside the uterus. A hysteroscopy should be done right after a menstrual period to be sure you are not pregnant.  LET YOUR HEALTH CARE PROVIDER KNOW ABOUT:  · Any allergies you have.  · All medicines you are taking, including vitamins, herbs, eye drops, creams, and over-the-counter medicines.  · Previous problems you or members of your family have had with the use of anesthetics.  · Any blood disorders you have.  · Previous surgeries you have had.  · Medical conditions you have.  RISKS AND COMPLICATIONS  Generally, this is a safe procedure. However, as with any procedure, complications can occur. Possible complications include:  · Putting a hole in the uterus.  · Excessive bleeding.  · Infection.  · Damage to the cervix.  · Injury to other organs.  · Allergic reaction to medicines.  · Too much fluid used in the uterus for the procedure.    BEFORE THE PROCEDURE  · Ask your health care provider about changing or stopping any regular medicines.  · Do not take aspirin or blood thinners for 1 week before the procedure, or as directed by your health care provider. These can cause bleeding.  · If you smoke, do not smoke for 2 weeks before the procedure.  · In some cases, a medicine is placed in the cervix the day before the procedure.  This medicine makes the cervix have a larger opening (dilate). This makes it easier for the instrument to be inserted into the uterus during the procedure.  · Do not eat or drink anything for at least 8 hours before the surgery.  · Arrange for someone to take you home after the procedure.  PROCEDURE  · You may be given a medicine to relax you (sedative). You may also be given one of the following:  ? A medicine that numbs the area around the cervix (local anesthetic).  ? A medicine that makes you sleep through the procedure (general anesthetic).  · The hysteroscope is inserted through the vagina into the uterus. The camera on the hysteroscope sends a picture to a TV screen. This gives the surgeon a good view inside the uterus.  · During the procedure, air or a liquid is put into the uterus, which allows the surgeon to see better.  · Sometimes, tissue is gently scraped from inside the uterus. These tissue samples are sent to a lab for testing.  What to expect after the procedure  · If you had a general anesthetic, you may be groggy for a couple hours after the procedure.  · If you had a local anesthetic, you will be able to go home as soon as you are stable and feel ready.  · You may have some cramping. This normally lasts for a couple days.  · You may   have bleeding, which varies from light spotting for a few days to menstrual-like bleeding for 3-7 days. This is normal.  · If your test results are not back during the visit, make an appointment with your health care provider to find out the results.  This information is not intended to replace advice given to you by your health care provider. Make sure you discuss any questions you have with your health care provider.  Document Released: 05/26/2000 Document Revised: 07/26/2015 Document Reviewed: 09/16/2012  Elsevier Interactive Patient Education © 2017 [employer]

## 2016-10-28 NOTE — Progress Notes (Signed)
Obstetrics and Gynecology Follow Up MAU Evaluation  Appointment Date: 10/28/2016  OBGYN Clinic: [facility]  Primary Care Provider: Patient, No Pcp Per  Referring Provider: MAU  Chief Complaint: pelvic pain, lost IUD  History of Present Illness: Samantha Suarez is a 31 y.o. Hispanic [id number] (Patient's last menstrual period was 10/01/2016 (approximate).), seen for the above chief complaint. PMHx significant for h/o c-section x 2.   Patient went to MAU on 8/25 for pelvic pain. No IUD strings seen and u/s obtained which showed malpositioned uterus. Pt set up for f/u visit for in office removal. UCx from then was negative  Patient seen in March b/c she couldn't feel the strings and exam showed strings on exam. Patient had Liletta IUD placed at 11/2015 PP visit by Dr. Adrian Blackwater.   No dysuria, vaginal discharge or itching, dyspareunia. Patient with off and on low belly cramping and discomfort.   Review of Systems:  as noted in the History of Present Illness.  Past Medical History:  Past Medical History:  Diagnosis Date  . Abnormal Pap smear of cervix 05/13/2013   ASC-US +HPV  . Gestational diabetes     Past Surgical History:  Past Surgical History:  Procedure Laterality Date  . CESAREAN SECTION N/A 10/12/2012   Procedure: CESAREAN SECTION;  Surgeon: Mickel Baas, MD;  Location: [facility];  Service: Obstetrics;  Laterality: N/A;  . CESAREAN SECTION N/A 10/16/2015   Procedure: CESAREAN SECTION;  Surgeon: Levie Heritage, DO;  Location: [facility] BIRTHING SUITES;  Service: Obstetrics;  Laterality: N/A;    Past Obstetrical History:  OB History  Gravida Para Term Preterm AB Living  3 2 2  0 1 2  SAB TAB Ectopic Multiple Live Births  1 0 0 0 2    # Outcome Date GA Lbr Len/2nd Weight Sex Delivery Anes PTL Lv  3 Term 10/16/15 [redacted]w[redacted]d  7 lb 13 oz (3.545 kg) M CS-LTranv Spinal  LIV  2 Term 10/12/12 [redacted]w[redacted]d 07:33 / 04:17 7 lb 6.5 oz (3.36 kg) F CS-LTranv EPI   LIV  1 SAB 05/2011              Past Gynecological History: As per HPI. Pap negative 05/2015  Social History:  Social History   Social History  . Marital status: Single    Spouse name: N/A  . Number of children: N/A  . Years of education: N/A   Occupational History  . Not on file.   Social History Main Topics  . Smoking status: Never Smoker  . Smokeless tobacco: Never Used  . Alcohol use Yes     Comment: occasional  . Drug use: No  . Sexual activity: Yes    Birth control/ protection: IUD   Other Topics Concern  . Not on file   Social History Narrative   ** Merged History Encounter **        Family History:  Family History  Problem Relation Age of Onset  . Hyperlipidemia Mother     Medications [name] had no medications administered during this visit. Current Outpatient Prescriptions  Medication Sig Dispense Refill  . ibuprofen (ADVIL,MOTRIN) 600 MG tablet Take 1 tablet (600 mg total) by mouth every 6 (six) hours as needed. 30 tablet 0  . ipratropium (ATROVENT) 0.06 % nasal spray Place 2 sprays into the nose 3 (three) times daily. (Patient not taking: Reported on 05/09/2016) 15 mL 0   No current facility-administered medications for this visit.  Allergies Patient has no known allergies.   Physical Exam:  BP 110/69   Pulse 77   Ht 4\' 9"  (1.448 m)   Wt 129 lb 1.6 oz (58.6 kg)   LMP 10/01/2016 (Approximate)   BMI 27.94 kg/m  Body mass index is 27.94 kg/m. General appearance: Well nourished, well developed female in no acute distress.  Cardiovascular: normal s1 and s2.  No murmurs, rubs or gallops. Respiratory:  Clear to auscultation bilateral. Normal respiratory effort Abdomen: soft, nttp, nd Neuro/Psych:  Normal mood and affect.  Skin:  Warm and dry.  Lymphatic:  No inguinal lymphadenopathy.   Pelvic exam: is not limited by body habitus EGBUS: within normal limits, Vagina: within normal limits and with no blood or discharge in the vault,  Cervix: very anterior, normal appearing cervix without tenderness, discharge or lesions. No strings seen. Uterus:  nonenlarged and non tender and Adnexa:  normal adnexa and no mass, fullness, tenderness Rectovaginal: deferred  See procedure for unsuccessful attempt at IUD removal.   Laboratory: as above.   Radiology:  CLINICAL DATA:  Pelvic pain for 3 weeks.  IUD.  EXAM: TRANSABDOMINAL AND TRANSVAGINAL ULTRASOUND OF PELVIS  TECHNIQUE: Both transabdominal and transvaginal ultrasound examinations of the pelvis were performed. Transabdominal technique was performed for global imaging of the pelvis including uterus, ovaries, adnexal regions, and pelvic cul-de-sac. It was necessary to proceed with endovaginal exam following the transabdominal exam to visualize the IUD and ovaries.  COMPARISON:  None  FINDINGS: Uterus  Measurements: 8.5 x 5.1 x 6.6 cm. Retroverted. No fibroids or other mass visualized.  Endometrium  Thickness: Obscured by acoustic shadowing from IUD. The IUD penetrates the myometrium in the anterior corpus to the serosal surface.  Right ovary  Measurements: 1.6 x 1.4 x 1.5 cm. Normal appearance/no adnexal mass.  Left ovary  Measurements: 3.9 x 2.4 x 3.1 cm. Normal appearance/no adnexal mass.  Other findings  No abnormal free fluid.  IMPRESSION: Malpositioned IUD, which penetrates into the uterine myometrium in the anterior corpus.  Normal appearance of both ovaries. No mass or free fluid identified.   Electronically Signed   By: Myles Rosenthal M.D.   On: 10/25/2016 16:42  Assessment: pt stable  Plan:   Recommend OR for hysteroscopy, IUD removal. Pt decided about future BC options and not sure if wants BTL.  RTC PRN  Cornelia Copa MD Attending [facility])

## 2016-10-28 NOTE — Addendum Note (Signed)
Addended by: Alston Bing on: 10/28/2016 05:54 PM   Modules accepted: Level of Service

## 2016-10-28 NOTE — Procedures (Signed)
Intrauterine Device (IUD) Removal Attempt Procedure Note  After written consent was obtained, the patient (or guardian) was asked to state their full name, date of birth, and the type of procedure being performed. A recent ultrasound showed the uterus to be retroverted.  Next, the cervix and vagina were cleaned with an antiseptic solution, and the cervix was grasped with a tenaculum.  A sound was easily passed to 8cm. A blunt IUD hook with circumferential notches in it was easily passed into the uterus to the fundus and spun around and the strings were attempted to be brought out but w/o success. This was done several times but w/o success. The tenaculum was removed and cervix was found to be hemostatic.    No complications, patient tolerated the procedure well.  Cornelia Copa MD Attending [facility])

## 2016-10-29 ENCOUNTER — Encounter (HOSPITAL_COMMUNITY): Payer: Self-pay

## 2016-11-14 ENCOUNTER — Other Ambulatory Visit: Payer: Self-pay | Admitting: Obstetrics and Gynecology

## 2016-11-25 ENCOUNTER — Encounter (HOSPITAL_BASED_OUTPATIENT_CLINIC_OR_DEPARTMENT_OTHER): Payer: Self-pay | Admitting: *Deleted

## 2016-11-25 NOTE — Progress Notes (Signed)
Pt planning to come Monday or Tuesday for Urine pregnancy test.

## 2016-11-26 ENCOUNTER — Other Ambulatory Visit: Payer: Self-pay | Admitting: Obstetrics and Gynecology

## 2016-12-02 ENCOUNTER — Encounter (HOSPITAL_BASED_OUTPATIENT_CLINIC_OR_DEPARTMENT_OTHER)
Admission: RE | Admit: 2016-12-02 | Discharge: 2016-12-02 | Disposition: A | Discharge status: Home / Self Care | Payer: Self-pay | Source: Ambulatory Visit | Attending: Obstetrics and Gynecology | Admitting: Obstetrics and Gynecology

## 2016-12-02 DIAGNOSIS — T8332XA Displacement of intrauterine contraceptive device, initial encounter: Secondary | ICD-10-CM | POA: Diagnosis present

## 2016-12-02 DIAGNOSIS — Y831 Surgical operation with implant of artificial internal device as the cause of abnormal reaction of the patient, or of later complication, without mention of misadventure at the time of the procedure: Secondary | ICD-10-CM | POA: Diagnosis not present

## 2016-12-02 DIAGNOSIS — Z01812 Encounter for preprocedural laboratory examination: Secondary | ICD-10-CM | POA: Insufficient documentation

## 2016-12-02 DIAGNOSIS — E119 Type 2 diabetes mellitus without complications: Secondary | ICD-10-CM | POA: Diagnosis not present

## 2016-12-02 DIAGNOSIS — Q512 Other doubling of uterus, unspecified: Secondary | ICD-10-CM | POA: Diagnosis not present

## 2016-12-02 LAB — POCT PREGNANCY, URINE: Preg Test, Ur: NEGATIVE

## 2016-12-02 NOTE — Progress Notes (Signed)
Pregnancy test: Negative

## 2016-12-02 NOTE — Anesthesia Preprocedure Evaluation (Signed)
Anesthesia Evaluation  Patient identified by MRN, date of birth, ID band Patient awake    Reviewed: Allergy & Precautions, H&P , Patient's Chart, lab work & pertinent test results, reviewed documented beta blocker date and time   Airway Mallampati: II  TM Distance: >3 FB Neck ROM: full    Dental no notable dental hx.    Pulmonary    Pulmonary exam normal breath sounds clear to auscultation       Cardiovascular  Rhythm:regular Rate:Normal     Neuro/Psych    GI/Hepatic   Endo/Other  diabetes  Renal/GU      Musculoskeletal   Abdominal   Peds  Hematology   Anesthesia Other Findings   Reproductive/Obstetrics                             Anesthesia Physical Anesthesia Plan  ASA: II  Anesthesia Plan: General   Post-op Pain Management:    Induction: Intravenous  PONV Risk Score and Plan: 2 and Ondansetron and Dexamethasone  Airway Management Planned: LMA  Additional Equipment:   Intra-op Plan:   Post-operative Plan:   Informed Consent: I have reviewed the patients History and Physical, chart, labs and discussed the procedure including the risks, benefits and alternatives for the proposed anesthesia with the patient or authorized representative who has indicated his/her understanding and acceptance.   Dental Advisory Given  Plan Discussed with: CRNA and Surgeon  Anesthesia Plan Comments: ( )        Anesthesia Quick Evaluation

## 2016-12-03 ENCOUNTER — Ambulatory Visit (HOSPITAL_BASED_OUTPATIENT_CLINIC_OR_DEPARTMENT_OTHER)
Admission: RE | Admit: 2016-12-03 | Discharge: 2016-12-03 | Disposition: A | Discharge status: Home / Self Care | Payer: Medicaid Other | Source: Ambulatory Visit | Attending: Obstetrics and Gynecology | Admitting: Obstetrics and Gynecology

## 2016-12-03 ENCOUNTER — Ambulatory Visit (HOSPITAL_BASED_OUTPATIENT_CLINIC_OR_DEPARTMENT_OTHER): Payer: Medicaid Other | Admitting: Anesthesiology

## 2016-12-03 ENCOUNTER — Encounter (HOSPITAL_BASED_OUTPATIENT_CLINIC_OR_DEPARTMENT_OTHER): Payer: Self-pay | Admitting: *Deleted

## 2016-12-03 ENCOUNTER — Encounter (HOSPITAL_BASED_OUTPATIENT_CLINIC_OR_DEPARTMENT_OTHER)
Admission: RE | Disposition: A | Discharge status: Home / Self Care | Payer: Self-pay | Source: Ambulatory Visit | Attending: Obstetrics and Gynecology

## 2016-12-03 DIAGNOSIS — Q512 Other doubling of uterus, unspecified: Secondary | ICD-10-CM | POA: Diagnosis not present

## 2016-12-03 DIAGNOSIS — E119 Type 2 diabetes mellitus without complications: Secondary | ICD-10-CM | POA: Diagnosis not present

## 2016-12-03 DIAGNOSIS — Q5128 Other doubling of uterus, other specified: Secondary | ICD-10-CM

## 2016-12-03 DIAGNOSIS — R102 Pelvic and perineal pain: Secondary | ICD-10-CM

## 2016-12-03 DIAGNOSIS — Y831 Surgical operation with implant of artificial internal device as the cause of abnormal reaction of the patient, or of later complication, without mention of misadventure at the time of the procedure: Secondary | ICD-10-CM | POA: Insufficient documentation

## 2016-12-03 DIAGNOSIS — T8332XD Displacement of intrauterine contraceptive device, subsequent encounter: Secondary | ICD-10-CM

## 2016-12-03 DIAGNOSIS — T8332XA Displacement of intrauterine contraceptive device, initial encounter: Secondary | ICD-10-CM | POA: Diagnosis not present

## 2016-12-03 HISTORY — DX: Other and unspecified doubling of uterus: Q51.28

## 2016-12-03 HISTORY — DX: Other doubling of uterus, unspecified: Q51.20

## 2016-12-03 HISTORY — PX: HYSTEROSCOPY: SHX211

## 2016-12-03 SURGERY — HYSTEROSCOPY
Anesthesia: General | Site: Uterus

## 2016-12-03 MED ORDER — FENTANYL CITRATE (PF) 100 MCG/2ML IJ SOLN
INTRAMUSCULAR | Status: AC
  Filled 2016-12-03: qty 2

## 2016-12-03 MED ORDER — LIDOCAINE HCL (CARDIAC) 20 MG/ML IV SOLN
INTRAVENOUS | Status: DC | PRN
  Administered 2016-12-03: 50 mg via INTRAVENOUS

## 2016-12-03 MED ORDER — LIDOCAINE HCL 1 % IJ SOLN
INTRAMUSCULAR | Status: DC | PRN
  Administered 2016-12-03: 20 mL

## 2016-12-03 MED ORDER — LIDOCAINE 2% (20 MG/ML) 5 ML SYRINGE
INTRAMUSCULAR | Status: AC
  Filled 2016-12-03: qty 5

## 2016-12-03 MED ORDER — SILVER NITRATE-POT NITRATE 75-25 % EX MISC
CUTANEOUS | Status: AC
  Filled 2016-12-03: qty 10

## 2016-12-03 MED ORDER — SODIUM CHLORIDE 0.9 % IR SOLN
Status: DC | PRN
  Administered 2016-12-03: 1

## 2016-12-03 MED ORDER — DEXAMETHASONE SODIUM PHOSPHATE 10 MG/ML IJ SOLN
INTRAMUSCULAR | Status: AC
  Filled 2016-12-03: qty 1

## 2016-12-03 MED ORDER — ROCURONIUM BROMIDE 10 MG/ML (PF) SYRINGE
PREFILLED_SYRINGE | INTRAVENOUS | Status: AC
  Filled 2016-12-03: qty 5

## 2016-12-03 MED ORDER — ONDANSETRON HCL 4 MG/2ML IJ SOLN
INTRAMUSCULAR | Status: AC
  Filled 2016-12-03: qty 2

## 2016-12-03 MED ORDER — NEOSTIGMINE METHYLSULFATE 5 MG/5ML IV SOSY
PREFILLED_SYRINGE | INTRAVENOUS | Status: AC
  Filled 2016-12-03: qty 5

## 2016-12-03 MED ORDER — ONDANSETRON HCL 4 MG/2ML IJ SOLN
INTRAMUSCULAR | Status: DC | PRN
  Administered 2016-12-03: 4 mg via INTRAVENOUS

## 2016-12-03 MED ORDER — MIDAZOLAM HCL 2 MG/2ML IJ SOLN
INTRAMUSCULAR | Status: AC
  Filled 2016-12-03: qty 2

## 2016-12-03 MED ORDER — SCOPOLAMINE 1 MG/3DAYS TD PT72: 1.0000 | MEDICATED_PATCH | Freq: Once | TRANSDERMAL | Status: DC | PRN

## 2016-12-03 MED ORDER — PROPOFOL 10 MG/ML IV BOLUS
INTRAVENOUS | Status: AC
  Filled 2016-12-03: qty 40

## 2016-12-03 MED ORDER — LACTATED RINGERS IV SOLN: INTRAVENOUS | Status: DC

## 2016-12-03 MED ORDER — METHYLERGONOVINE MALEATE 0.2 MG/ML IJ SOLN
INTRAMUSCULAR | Status: AC
  Filled 2016-12-03: qty 1

## 2016-12-03 MED ORDER — LACTATED RINGERS IV SOLN
INTRAVENOUS | Status: DC
  Administered 2016-12-03: 08:00:00 via INTRAVENOUS

## 2016-12-03 MED ORDER — KETOROLAC TROMETHAMINE 30 MG/ML IJ SOLN
INTRAMUSCULAR | Status: DC | PRN
  Administered 2016-12-03: 30 mg via INTRAVENOUS

## 2016-12-03 MED ORDER — PROPOFOL 10 MG/ML IV BOLUS
INTRAVENOUS | Status: DC | PRN
  Administered 2016-12-03: 150 mg via INTRAVENOUS

## 2016-12-03 MED ORDER — LIDOCAINE HCL (PF) 1 % IJ SOLN
INTRAMUSCULAR | Status: AC
  Filled 2016-12-03: qty 30

## 2016-12-03 MED ORDER — FENTANYL CITRATE (PF) 100 MCG/2ML IJ SOLN
50.0000 ug | INTRAMUSCULAR | Status: DC | PRN
  Administered 2016-12-03: 50 ug via INTRAVENOUS

## 2016-12-03 MED ORDER — MIDAZOLAM HCL 2 MG/2ML IJ SOLN
1.0000 mg | INTRAMUSCULAR | Status: DC | PRN
  Administered 2016-12-03: 2 mg via INTRAVENOUS

## 2016-12-03 MED ORDER — FENTANYL CITRATE (PF) 100 MCG/2ML IJ SOLN: 25.0000 ug | INTRAMUSCULAR | Status: DC | PRN

## 2016-12-03 MED ORDER — DEXAMETHASONE SODIUM PHOSPHATE 10 MG/ML IJ SOLN
INTRAMUSCULAR | Status: DC | PRN
  Administered 2016-12-03: 10 mg via INTRAVENOUS

## 2016-12-03 MED ORDER — KETOROLAC TROMETHAMINE 30 MG/ML IJ SOLN
INTRAMUSCULAR | Status: AC
  Filled 2016-12-03: qty 1

## 2016-12-03 MED ORDER — SILVER NITRATE-POT NITRATE 75-25 % EX MISC
CUTANEOUS | Status: DC | PRN
  Administered 2016-12-03: 3

## 2016-12-03 MED ORDER — IBUPROFEN 600 MG PO TABS: 600.0000 mg | ORAL_TABLET | Freq: Four times a day (QID) | ORAL | Status: DC | PRN

## 2016-12-03 SURGICAL SUPPLY — 16 items
CANISTER SUCT 3000ML PPV (MISCELLANEOUS) ×3 IMPLANT
CATH ROBINSON RED A/P 16FR (CATHETERS) ×3 IMPLANT
CLOTH BEACON ORANGE TIMEOUT ST (SAFETY) ×3 IMPLANT
CONTAINER PREFILL 10% NBF 60ML (FORM) ×6 IMPLANT
GLOVE BIOGEL PI IND STRL 7.0 (GLOVE) ×1 IMPLANT
GLOVE BIOGEL PI IND STRL 7.5 (GLOVE) ×1 IMPLANT
GLOVE BIOGEL PI INDICATOR 7.0 (GLOVE) ×2
GLOVE BIOGEL PI INDICATOR 7.5 (GLOVE) ×2
GLOVE SURG SS PI 7.0 STRL IVOR (GLOVE) ×3 IMPLANT
GOWN STRL REUS W/TWL LRG LVL3 (GOWN DISPOSABLE) ×3 IMPLANT
GOWN STRL REUS W/TWL XL LVL3 (GOWN DISPOSABLE) ×3 IMPLANT
PACK VAGINAL MINOR WOMEN LF (CUSTOM PROCEDURE TRAY) ×3 IMPLANT
PAD OB MATERNITY 4.3X12.25 (PERSONAL CARE ITEMS) ×3 IMPLANT
TOWEL OR 17X24 6PK STRL BLUE (TOWEL DISPOSABLE) ×6 IMPLANT
TUBING AQUILEX INFLOW (TUBING) ×3 IMPLANT
TUBING AQUILEX OUTFLOW (TUBING) ×3 IMPLANT

## 2016-12-03 NOTE — Transfer of Care (Signed)
Immediate Anesthesia Transfer of Care Note  Patient: Samantha Suarez  Procedure(s) Performed: HYSTEROSCOPY & IUD Removal (N/A Uterus)  Patient Location: PACU  Anesthesia Type:General  Level of Consciousness: awake, alert  and oriented  Airway & Oxygen Therapy: Patient Spontanous Breathing and Patient connected to face mask oxygen  Post-op Assessment: Report given to RN and Post -op Vital signs reviewed and stable  Post vital signs: Reviewed and stable  Last Vitals:  Vitals:   12/03/16 0805  BP: 108/69  Pulse: 68  Resp: 18  Temp: 37 C  SpO2: 100%    Last Pain:  Vitals:   12/03/16 0805  TempSrc: Oral      Patients Stated Pain Goal: 0 (12/03/16 0805)  Complications: No apparent anesthesia complications

## 2016-12-03 NOTE — H&P (Signed)
Obstetrics and Gynecology Pre Op H&P Evaluation  Surgery Date: 12/03/2016  OBGYN Clinic: Center for [facility]  Primary Care Provider: Patient, No Pcp Per  Referring Provider: No ref. provider found  Chief Complaint: scheduled surgery. Lost IUD  History of Present Illness: Samantha Suarez is a 31 y.o. Hispanic [id number] (Patient's last menstrual period was 11/06/2016 (exact date).), seen for the above chief complaint. Her past medical history is significant for c-section x 2   Seen in clinic on 8/28 and unable to see IUD strings and unable to remove in clinic so pt set up for OR removal.    No current bleeding or pain. Pt undecided about BC in the future.     Review of Systems:  as noted in the History of Present Illness.   Past Medical History:  Past Medical History:  Diagnosis Date  . Abnormal Pap smear of cervix 05/13/2013   ASC-US +HPV  . Gestational diabetes     Past Surgical History:  Past Surgical History:  Procedure Laterality Date  . CESAREAN SECTION N/A 10/12/2012   Procedure: CESAREAN SECTION;  Surgeon: Mickel Baas, MD;  Location: [facility];  Service: Obstetrics;  Laterality: N/A;  . CESAREAN SECTION N/A 10/16/2015   Procedure: CESAREAN SECTION;  Surgeon: Levie Heritage, DO;  Location: [facility] BIRTHING SUIT[facility];  Service: Obstetrics;  Laterality: N/A;    Past Obstetrical History:  OB History  Gravida Para Term Preterm AB Living  0 1 2  SAB TAB Ectopic Multiple Live Births  1 0 0 0 2    # Outcome Date GA Lbr Len/2nd Weight Sex Delivery Anes PTL Lv  3 Term 10/16/15 [redacted]w[redacted]d  7 lb 13 oz (3.545 kg) M CS-LTranv Spinal  LIV  2 Term 10/12/12 [redacted]w[redacted]d 07:33 / 04:17 7 lb 6.5 oz (3.36 kg) F CS-LTranv EPI  LIV  1 SAB 05/2011               Past Gynecological History: As per HPI. Pap negative 05/2015  Social History:  Social History   Social History  . Marital status: Single    Spouse name: N/A  . Number of children: N/A  . Years of education: N/A    Occupational History  . Not on file.   Social History Main Topics  . Smoking status: Never Smoker  . Smokeless tobacco: Never Used  . Alcohol use Yes     Comment: occasional  . Drug use: No  . Sexual activity: Yes    Birth control/ protection: IUD   Other Topics Concern  . Not on file   Social History Narrative   ** Merged History Encounter **        Family History:  Family History  Problem Relation Age of Onset  . Hyperlipidemia Mother      Medications @ Current Facility-Administered Medications  Medication Dose Route Frequency Provider Last Rate Last Dose  . fentaNYL (SUBLIMAZE) injection 50-100 mcg  50-100 mcg Intravenous PRN Lewie Loron, MD      . lactated ringers infusion   Intravenous Continuous Lewie Loron, MD 10 mL/hr at 12/03/16 [phone]    . lactated ringers infusion   Intravenous Continuous Independence Bing, MD      . midazolam (VERSED) injection 1-2 mg  1-2 mg Intravenous PRN Lewie Loron, MD      . scopolamine (TRANSDERM-SCOP) 1 MG/3DAYS 1.5 mg  1 patch Transdermal Once PRN Lewie Loron, MD        Allergies Patient has no  known allergies.   Physical Exam:  BP 108/69   Pulse 68   Temp 98.6 F (37 C) (Oral)   Resp 18   Ht  (1.448 m)   Wt 127 lb (57.6 kg)   LMP 11/06/2016 (Exact Date)   SpO2 100%   BMI 27.48 kg/m  Body mass index is 27.48 kg/m. General appearance: Well nourished, well developed female in no acute distress.  Cardiovascular: normal s1 and s2.  No murmurs, rubs or gallops. Respiratory:  Clear to auscultation bilateral. Normal respiratory effort Abdomen: positive bowel sounds and no masses, hernias; diffusely non tender to palpation, non distended  Neuro/Psych:  Normal mood and affect.  Skin:  Warm and dry.    Laboratory: UPT neg  Radiology: reveiwed  Assessment: pt doing well  Plan: d/w pt and okay to proceed with h/s and IUD removal.    [place] Bing, Montez Hageman MD Attending Center for  [facility])

## 2016-12-03 NOTE — Anesthesia Procedure Notes (Signed)
Procedure Name: LMA Insertion Performed by: Karen Kitchens Pre-anesthesia Checklist: Patient identified, Emergency Drugs available, Suction available, Patient being monitored and Timeout performed Patient Re-evaluated:Patient Re-evaluated prior to induction Oxygen Delivery Method: Circle system utilized Preoxygenation: Pre-oxygenation with 100% oxygen Induction Type: IV induction Ventilation: Mask ventilation without difficulty LMA: LMA inserted LMA Size: 3.0 Tube type: Oral Tube secured with: Tape Dental Injury: Teeth and Oropharynx as per pre-operative assessment

## 2016-12-03 NOTE — Op Note (Signed)
Operative Note   12/03/2016  PRE-OP DIAGNOSIS: Lost IUD. Malpositioned IUD on ultrasound. Pelvic pain   POST-OP DIAGNOSIS: Same. Septate uterus   SURGEON: Surgeon(s) and Role:    * Fionnuala Hemmerich, Billey Gosling, MD - Primary  ASSISTANT: None  PROCEDURE: IUD removal and hysteroscopy  ANESTHESIA: General and paracervical block  ESTIMATED BLOOD LOSS: 15mL  DRAINS: I/O foley for UOP    TOTAL IV FLUIDS: per anesthesia record  SPECIMENS: none  VTE PROPHYLAXIS: SCDs to the bilateral lower extremities  ANTIBIOTICS: not indicated  FLUID DEFICIT: 90mL  COMPLICATIONS: none  DISPOSITION: PACU - hemodynamically stable.  CONDITION: stable  FINDINGS: Exam under anesthesia revealed normal EGBUS with normal vagina and cervix, but the cervix was very posterior and anterior but was able to be easily brought down with a single toothed tenaculum. No adnexal masses or fullness and small, immobile uterus on bimanual exam.  On hysteroscopy, normal endocervical canal. There is a small septum that divided the uterine cavity with normal appearing ostia bilaterally. The lining of the endometrium was normal and atrophic appearing. White IUD intact upon removal after inspection.   PROCEDURE IN DETAIL:  After informed consent was obtained, the patient was taken to the operating room where anesthesia was obtained without difficulty. The patient was positioned in the dorsal lithotomy position in [place] stirrups.  The patient's bladder was catheterized with an in and out foley catheter.  The patient was examined under anesthesia, with the above noted findings.  The side arm speculum was placed inside the patient's vagina and unable to visualize cervix. A sims retractor and weighted speculum were then used but still unable to visualize. On bimanual, the cervix was palpated and then a tenaculum placed on the posterior lip. Then the speculum was then placed, again, and the cervix easily brought down into view. A  paracervical block was achieved with 20mL 1% lidocaine.  The IUD strings weren't visualized so an IUD hook was used to attempt to bring the strings to the os. This was successful, unlike in clinic, and the strings were then grasped with ringed forceps and the IUD easily removed. The cervix was already dilated enough to pass the diagnostic hysteroscope; it was then introduced, with the above noted findings. Excellent hemostasis was noted, and all instruments were removed, with excellent hemostasis noted throughout.  She was then taken out of dorsal lithotomy. The patient tolerated the procedure well.  Sponge, lap and instrument counts were correct x2.  The patient was taken to recovery room in excellent condition.  Cornelia Copa MD Attending Center for [employer])

## 2016-12-03 NOTE — Discharge Instructions (Addendum)
 We will discuss your surgery once again in detail at your post-op visit in two to four weeks. If you haven't already done so, please call to make your appointment as soon as possible.  These instructions give you information on caring for yourself after your procedure. Your doctor may also give you more specific instructions. Call your doctor if you have any problems or questions after your procedure. HOME CARE Do not drive for 24 hours. Wait 1 week before doing any activities that wear you out. Do not stand for a long time. Limit stair climbing to once or twice a day. Rest often. Continue with your usual diet. Drink enough fluids to keep your pee (urine) clear or pale yellow. If you have a hard time pooping (constipation), you may: Take a medicine to help you go poop (laxative) as told by your doctor. Eat more fruit and bran. Drink more fluids. Take showers, not baths, for as long as told by your doctor. Do not swim or use a hot tub until your doctor says it is okay. Have someone with you for 1day after the procedure. Do not douche, use tampons, or have sex (intercourse) until seen by your doctor Only take medicines as told by your doctor. Do not take aspirin. It can cause bleeding. Keep all doctor visits. GET HELP IF: You have cramps or pain not helped by medicine. You have new pain in the belly (abdomen). You have a bad smelling fluid coming from your vagina. You have a rash. You have problems with any medicine. GET HELP RIGHT AWAY IF:  You start to bleed more than a regular period. You have a fever. You have chest pain. You have trouble breathing. You feel dizzy or feel like passing out (fainting). You pass out. You have pain in the tops of your shoulders. You have vaginal bleeding with or without clumps of blood (blood clots). MAKE SURE YOU: Understand these instructions. Will watch your condition. Will get help right away if you are not doing well or get  worse. Document Released: 11/27/2007 Document Revised: 02/22/2013 Document Reviewed: 09/16/2012 ExitCare Patient Information 2015 [employer]. This information is not intended to replace advice given to you by your health care provider. Make sure you discuss any questions you have with your health care provider.   Post Anesthesia Home Care Instructions  Activity: Get plenty of rest for the remainder of the day. A responsible individual must stay with you for 24 hours following the procedure.  For the next 24 hours, DO NOT: -Drive a car -Operate machinery -Drink alcoholic beverages -Take any medication unless instructed by your physician -Make any legal decisions or sign important papers.  Meals: Start with liquid foods such as gelatin or soup. Progress to regular foods as tolerated. Avoid greasy, spicy, heavy foods. If nausea and/or vomiting occur, drink only clear liquids until the nausea and/or vomiting subsides. Call your physician if vomiting continues.  Special Instructions/Symptoms: Your throat may feel dry or sore from the anesthesia or the breathing tube placed in your throat during surgery. If this causes discomfort, gargle with warm salt water. The discomfort should disappear within 24 hours.  If you had a scopolamine patch placed behind your ear for the management of post- operative nausea and/or vomiting:  1. The medication in the patch is effective for 72 hours, after which it should be removed.  Wrap patch in a tissue and discard in the trash. Wash hands thoroughly with soap and water. 2. You may   remove the patch earlier than 72 hours if you experience unpleasant side effects which may include dry mouth, dizziness or visual disturbances. 3. Avoid touching the patch. Wash your hands with soap and water after contact with the patch.     

## 2016-12-03 NOTE — Anesthesia Postprocedure Evaluation (Signed)
Anesthesia Post Note  Patient: Bella Brummet  Procedure(s) Performed: HYSTEROSCOPY & IUD Removal (N/A Uterus)     Patient location during evaluation: PACU Anesthesia Type: General Level of consciousness: awake and alert Pain management: pain level controlled Vital Signs Assessment: post-procedure vital signs reviewed and stable Respiratory status: spontaneous breathing, nonlabored ventilation, respiratory function stable and patient connected to nasal cannula oxygen Cardiovascular status: blood pressure returned to baseline and stable Postop Assessment: no apparent nausea or vomiting Anesthetic complications: no    Last Vitals:  Vitals:   12/03/16 0945 12/03/16 0958  BP:  (!) 115/91  Pulse: 73 98  Resp: 10 16  Temp:  36.5 C  SpO2: 100% 98%    Last Pain:  Vitals:   12/03/16 0958  TempSrc:   PainSc: 0-No pain                 Javiana Anwar EDWARD

## 2016-12-04 ENCOUNTER — Encounter (HOSPITAL_BASED_OUTPATIENT_CLINIC_OR_DEPARTMENT_OTHER): Payer: Self-pay | Admitting: Obstetrics and Gynecology

## 2016-12-26 ENCOUNTER — Encounter: Payer: Self-pay | Admitting: Family Medicine

## 2016-12-26 ENCOUNTER — Ambulatory Visit (INDEPENDENT_AMBULATORY_CARE_PROVIDER_SITE_OTHER): Payer: Self-pay | Admitting: Family Medicine

## 2016-12-26 VITALS — BP 109/69 | HR 59 | Temp 98.3°F | Wt 127.2 lb

## 2016-12-26 DIAGNOSIS — R87610 Atypical squamous cells of undetermined significance on cytologic smear of cervix (ASC-US): Secondary | ICD-10-CM

## 2016-12-26 DIAGNOSIS — Z9889 Other specified postprocedural states: Secondary | ICD-10-CM

## 2016-12-26 DIAGNOSIS — Z30011 Encounter for initial prescription of contraceptive pills: Secondary | ICD-10-CM

## 2016-12-26 DIAGNOSIS — R87810 Cervical high risk human papillomavirus (HPV) DNA test positive: Secondary | ICD-10-CM

## 2016-12-26 MED ORDER — NORGESTIMATE-ETH ESTRADIOL 0.25-35 MG-MCG PO TABS: 1.0000 | ORAL_TABLET | Freq: Every day | ORAL | 3 refills | Status: DC

## 2016-12-26 NOTE — Progress Notes (Signed)
   Subjective:    Patient ID: Samantha Suarez is a 31 y.o. female presenting with No chief complaint on file.  on 12/26/2016  HPI: Here today for f/u. She underwent IUD removal in the OR due to lost strings. Hysteroscopy at the same time revealed uterine septum. She has recovered well and has no symptoms. She is interested in contraception, and would like OC's. 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  Review of Systems  Constitutional: Negative for chills and fever.  Respiratory: Negative for shortness of breath.   Cardiovascular: Negative for chest pain.  Gastrointestinal: Negative for abdominal pain, nausea and vomiting.  Genitourinary: Negative for dysuria.  Skin: Negative for rash.      Objective:    BP 109/69   Pulse (!) 59   Temp 98.3 F (36.8 C)   Wt 127 lb 3.2 oz (57.7 kg)   LMP 12/05/2016   BMI 27.53 kg/m  Physical Exam  Constitutional: She is oriented to person, place, and time. She appears well-developed and well-nourished. No distress.  HENT:  Head: Normocephalic and atraumatic.  Eyes: No scleral icterus.  Neck: Neck supple.  Cardiovascular: Normal rate.   Pulmonary/Chest: Effort normal.  Abdominal: Soft.  Neurological: She is alert and oriented to person, place, and time.  Skin: Skin is warm and dry.  Psychiatric: She has a normal mood and affect.        Assessment & Plan:  Encounter for initial prescription of contraceptive pills - begin Sprintec after next menses. Risks reviewed as well as break through bleeding. - Plan: norgestimate-ethinyl estradiol (ORTHO-CYCLEN,SPRINTEC,PREVIFEM) 0.25-35 MG-MCG tablet  Atypical squamous cell changes of undetermined significance (ASCUS) on cervical cytology with positive high risk human papilloma virus (HPV)  Return if symptoms worsen or fail to improve.   Reva Boresanya S Jalesa Thien 12/26/2016 2:00 PM

## 2016-12-26 NOTE — Patient Instructions (Signed)

## 2017-02-20 ENCOUNTER — Encounter (HOSPITAL_COMMUNITY): Payer: Self-pay

## 2018-02-03 DIAGNOSIS — Z302 Encounter for sterilization: Secondary | ICD-10-CM | POA: Insufficient documentation

## 2018-04-17 IMAGING — US US MFM OB COMP +14 WKS
1 series · 14 of 28 positions shown · non-contrast
Comparison: none

[Series 1: us mfm ob comp +14 wks · 94 acquisitions, 14 frames shown]
[im 4/94]
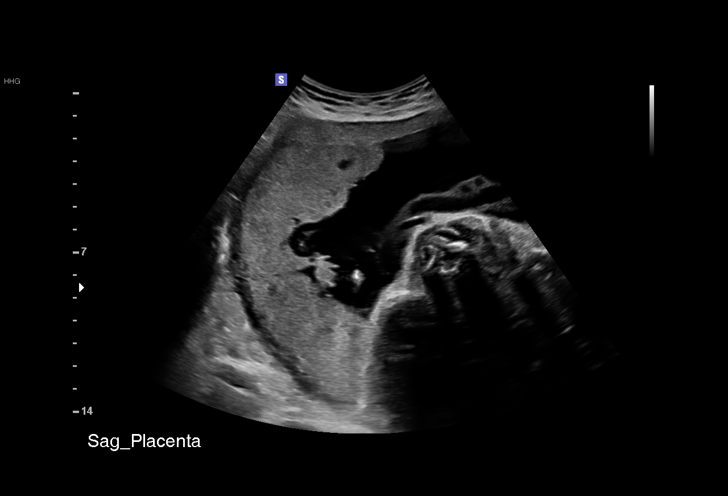
[im 11/94]
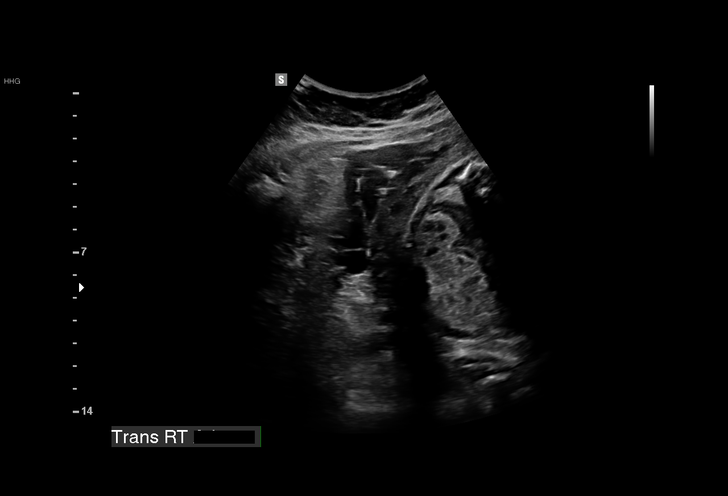
[im 18/94]
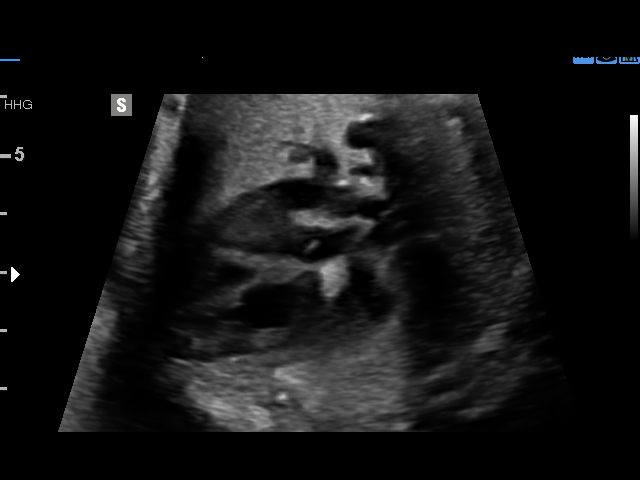
[im 25/94]
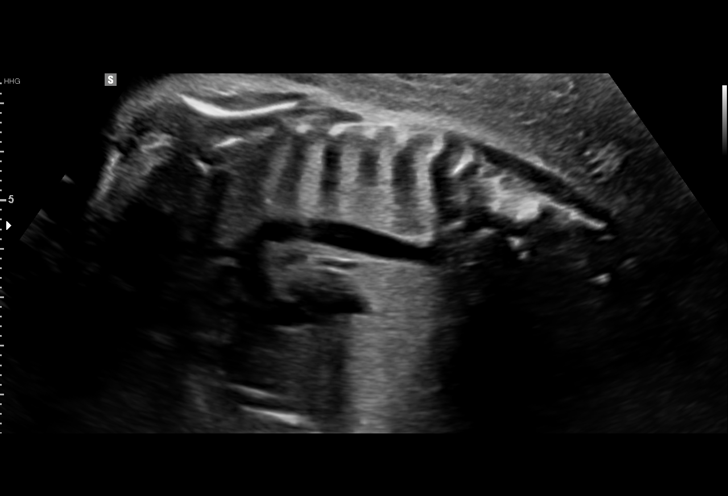
[im 32/94]
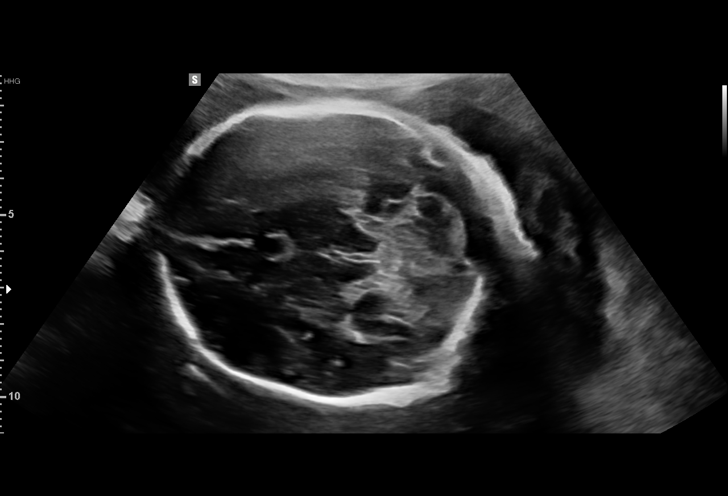
[im 38/94]
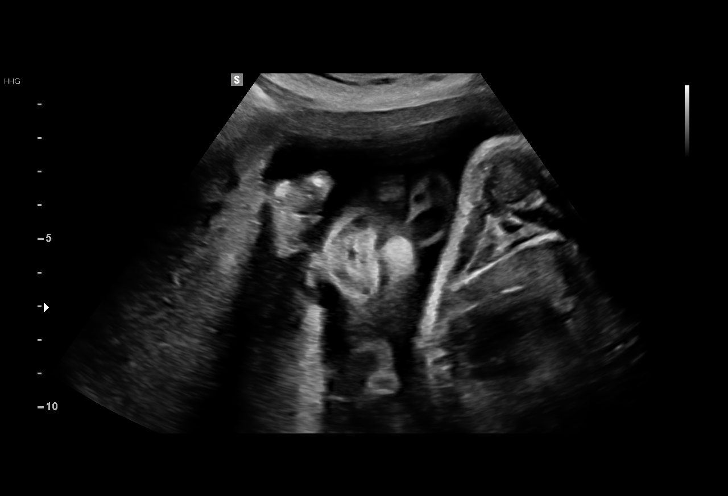
[im 45/94]
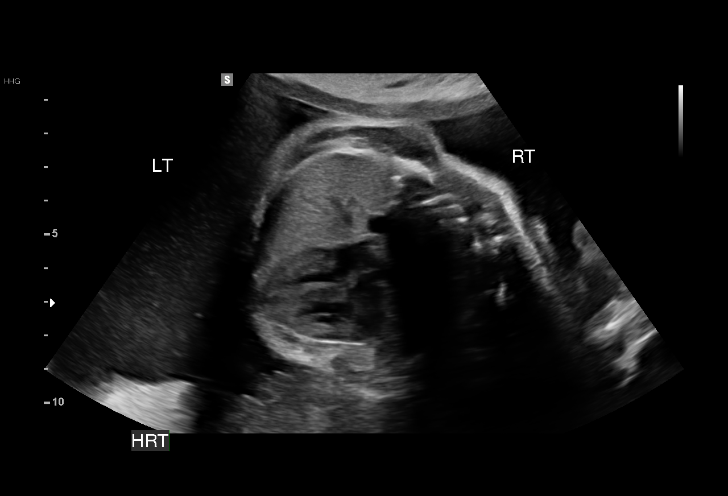
[im 52/94]
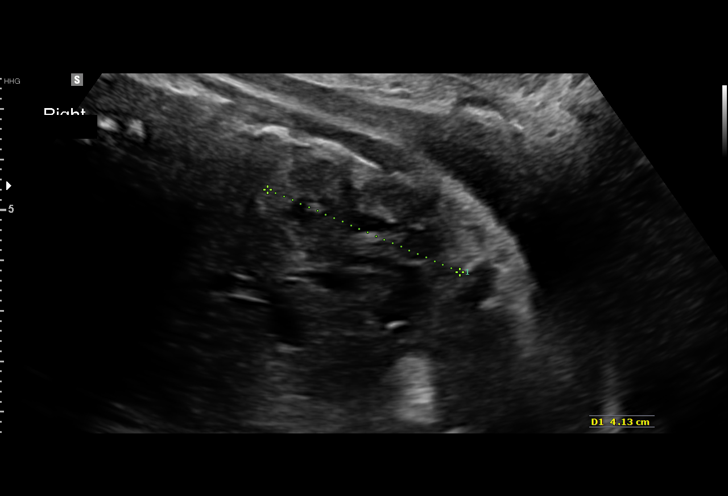
[im 59/94]
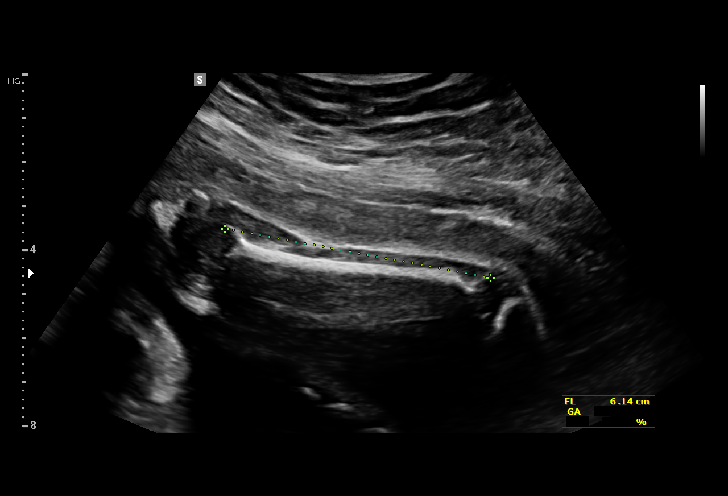
[im 66/94]
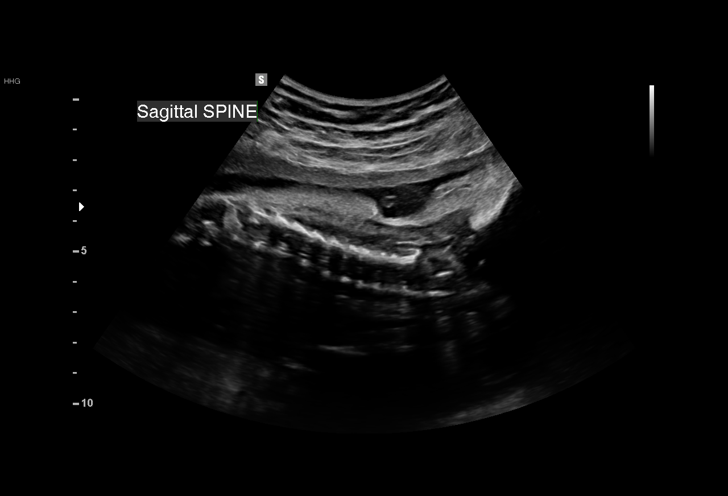
[im 73/94]
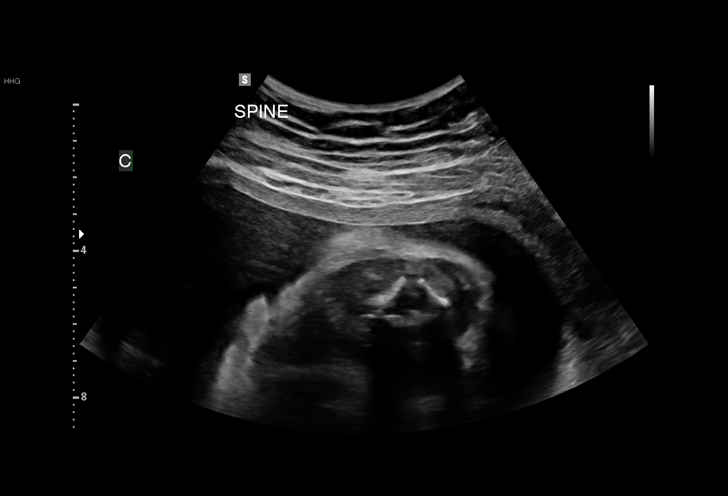
[im 80/94]
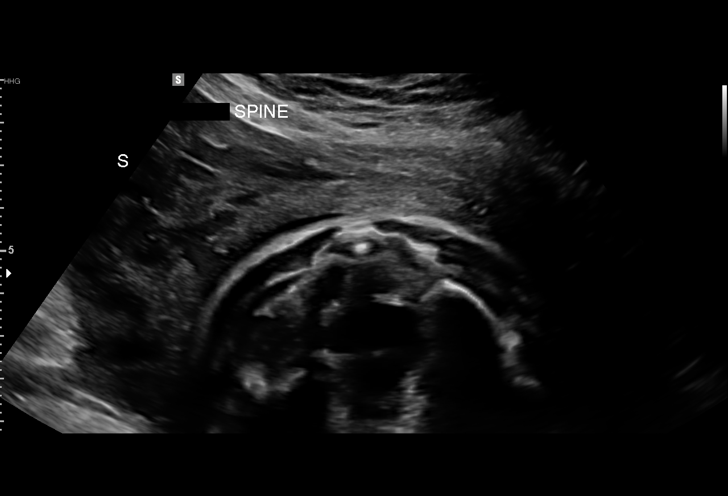
[im 87/94]
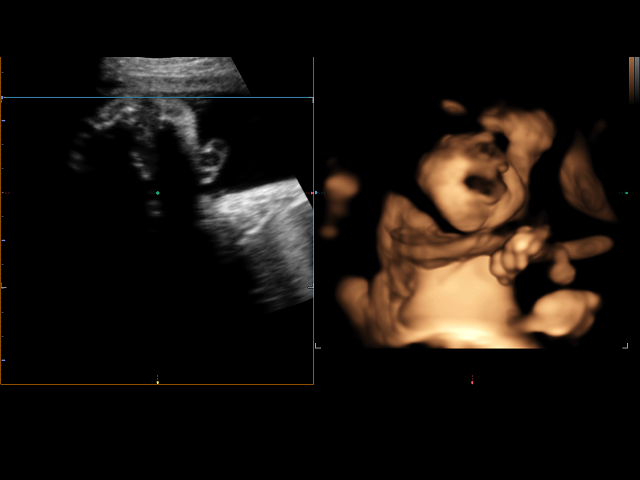
[im 94/94]
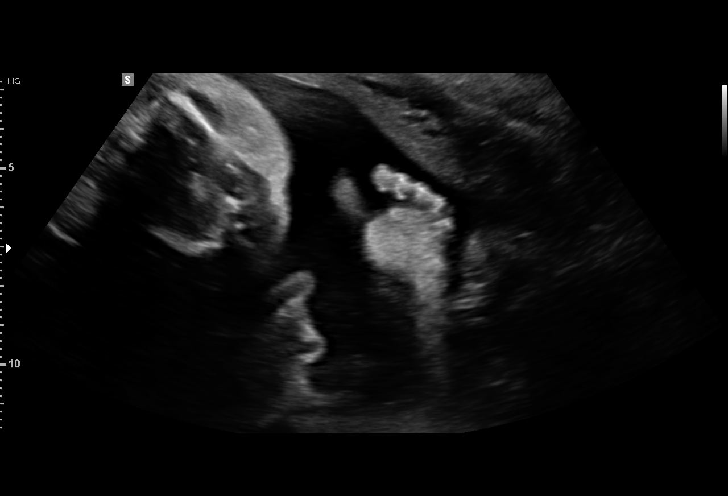

[14 of 28 positions shown; findings below may reference images not displayed]

Hospital Clinic-
Faculty Physician
OB/Gyn Clinic

1  CLERGER FEDELINE PARVELUS            [id number]       [id number]     [id number]
Indications

31 weeks gestation of pregnancy
Gestational diabetes in pregnancy, diet
controlled
Previous cesarean delivery, antepartum
Basic anatomic survey                          Z36
OB History

Blood Type:            Height:  4'9"   Weight (lb):  152      BMI:
Gravidity:    3         Term:   1         SAB:   1
Living:       1
Fetal Evaluation

Num Of Fetuses:     1
Fetal Heart         131
Rate(bpm):
Cardiac Activity:   Observed
Presentation:       Breech
Placenta:           Fundal, above cervical os
P. Cord Insertion:  Visualized, central
Amniotic Fluid
AFI FV:      Subjectively upper-normal

AFI Sum(cm)     %Tile       Largest Pocket(cm)
24.25           95

RUQ(cm)       RLQ(cm)       LUQ(cm)        LLQ(cm)
5.91
Biometry

BPD:      79.1  mm     G. Age:  31w 5d         63  %    CI:        74.18   %   70 - 86
FL/HC:      20.4   %   19.3 -
HC:      291.6  mm     G. Age:  32w 1d         45  %    HC/AC:      1.05       0.96 -
AC:       277   mm     G. Age:  31w 6d         69  %    FL/BPD:     75.2   %   71 - 87
FL:       59.5  mm     G. Age:  31w 0d         36  %    FL/AC:      21.5   %   20 - 24
HUM:      56.3  mm     G. Age:  32w 5d         87  %

Est. FW:    7017  gm    3 lb 15 oz      66  %
Gestational Age

LMP:           31w 0d       Date:   01/12/15                 EDD:   10/19/15
U/S Today:     31w 5d                                        EDD:   10/14/15
Best:          31w 0d    Det. By:   LMP  (01/12/15)          EDD:   10/19/15
Anatomy

Cranium:               Appears normal         Aortic Arch:            Appears normal
Cavum:                 Appears normal         Ductal Arch:            Appears normal
Ventricles:            Appears normal         Diaphragm:              Appears normal
Choroid Plexus:        Appears normal         Stomach:                Appears normal, left
sided
Cerebellum:            Appears normal         Abdomen:                Appears normal
Posterior Fossa:       Appears normal         Abdominal Wall:         Appears nml (cord
insert, abd wall)
Nuchal Fold:           Not applicable (>20    Cord Vessels:           Appears normal (3
wks GA)                                        vessel cord)
Face:                  Not well visualized    Kidneys:                Appear normal
Lips:                  Appears normal         Bladder:                Appears normal
Thoracic:              Appears normal         Spine:                  Ltd views no
intracranial signs of
NTD
Heart:                 Appears normal         Upper Extremities:      Visualized
(4CH, axis, and
situs)
RVOT:                  Appears normal         Lower Extremities:      Visualized
LVOT:                  Appears normal

Other:  Fetus appears to be a male. Nasal bone visualized. Technically
difficult due to advanced GA and fetal position.
Cervix Uterus Adnexa

Cervix
Not visualized (advanced GA >75wks)

Uterus
No abnormality visualized.
Left Ovary
Not visualized.

Right Ovary
Not visualized.

Adnexa:       No adnexal mass visualized.
Impression

SIUP at 31+0 weeks
Normal detailed fetal anatomy; limited views of face and spine
High normal amniotic fluid volume
Measurements consistent with LMP dating; EFW at the 66th
%tile
Recommendations

Follow-up as clinically indicated

## 2018-11-30 ENCOUNTER — Encounter: Payer: Self-pay | Admitting: Registered Nurse

## 2018-11-30 ENCOUNTER — Ambulatory Visit (INDEPENDENT_AMBULATORY_CARE_PROVIDER_SITE_OTHER): Payer: BC Managed Care – PPO | Admitting: Registered Nurse

## 2018-11-30 ENCOUNTER — Other Ambulatory Visit: Payer: Self-pay

## 2018-11-30 VITALS — BP 111/68 | HR 80 | Temp 98.0°F | Resp 16 | Ht 59.84 in | Wt 118.8 lb

## 2018-11-30 DIAGNOSIS — R300 Dysuria: Secondary | ICD-10-CM | POA: Diagnosis not present

## 2018-11-30 LAB — POC MICROSCOPIC URINALYSIS (UMFC): Mucus: ABSENT

## 2018-11-30 LAB — POCT URINALYSIS DIP (MANUAL ENTRY)
Bilirubin, UA: NEGATIVE
Glucose, UA: NEGATIVE mg/dL
Nitrite, UA: NEGATIVE
Protein Ur, POC: 100 mg/dL — AB
Spec Grav, UA: <=1.005 — AB (ref 1.010–1.025)
Urobilinogen, UA: 0.2 E.U./dL
pH, UA: 6 (ref 5.0–8.0)

## 2018-11-30 MED ORDER — PHENAZOPYRIDINE HCL 95 MG PO TABS: 95.0000 mg | ORAL_TABLET | Freq: Three times a day (TID) | ORAL | 0 refills | Status: DC | PRN

## 2018-11-30 MED ORDER — NITROFURANTOIN MONOHYD MACRO 100 MG PO CAPS: 100.0000 mg | ORAL_CAPSULE | Freq: Two times a day (BID) | ORAL | 0 refills | Status: DC

## 2018-11-30 MED ORDER — NITROFURANTOIN MONOHYD MACRO 100 MG PO CAPS: 100.0000 mg | ORAL_CAPSULE | Freq: Two times a day (BID) | ORAL | 0 refills | Status: AC

## 2018-11-30 NOTE — Patient Instructions (Signed)
° ° ° °  If you have lab work done today you will be contacted with your lab results within the next 2 weeks.  If you have not heard from us then please contact us. The fastest way to get your results is to register for My Chart. ° ° °IF you received an x-ray today, you will receive an invoice from [facility]. Please contact [facility] at [phone] with questions or concerns regarding your invoice.  ° °IF you received labwork today, you will receive an invoice from [facility]. Please contact [facility] at [phone] with questions or concerns regarding your invoice.  ° °Our billing staff will not be able to assist you with questions regarding bills from these companies. ° °You will be contacted with the lab results as soon as they are available. The fastest way to get your results is to activate your My Chart account. Instructions are located on the last page of this paperwork. If you have not heard from us regarding the results in 2 weeks, please contact this office. °  ° ° ° °

## 2018-11-30 NOTE — Progress Notes (Signed)
Acute Office Visit  Subjective:    Patient ID: Samantha Suarez, female    DOB: June 15, 1985, 33 y.o.   MRN: [id number]  Chief Complaint  Patient presents with  . Abdominal Pain    since sunday with some plevis pain also   . Dysuria    with frequency    HPI Patient is in today for dysuria  Notes that since Sunday, she has had burning after urination, suprapubic pain, frequency, and urgency. Denies vaginal discharge, odor, itching, as well as gross hematuria, CVA tenderness, fever, shob, chills.   Has had UTI in the past. Has had similar symptoms. Denies STI exposure. Denies [facility].  Past Medical History:  Diagnosis Date  . Abnormal Pap smear of cervix 05/13/2013   ASC-US +HPV  . Gestational diabetes   . 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 CESAREAN SECTION N/A 10/12/2012   Procedure: CESAREAN SECTION;  Surgeon: Mickel Baasichard D Kaplan, MD;  Location: [facility];  Service: Obstetrics;  Laterality: N/A;  . CESAREAN SECTION N/A 10/16/2015   Procedure: CESAREAN SECTION;  Surgeon: Levie HeritageJacob J Stinson, DO;  [facility];  Service: Obstetrics;  Laterality: N/A;  . HYSTEROSCOPY N/A 12/03/2016   Procedure: HYSTEROSCOPY & IUD Removal;  Surgeon: [place] BingPickens, Charlie, MD;  Location: [facility];  Service: Gynecology;  Laterality: N/A;    Family History  Problem Relation Age of Onset  . Hyperlipidemia Mother     Social History   Socioeconomic History  . Marital status: Single    Spouse name: Not on file  . Number of children: Not on file  . Years of education: Not on file  . Highest education level: Not on file  Occupational History  . Not on file  Social Needs  . Financial resource strain: Not on file  . Food insecurity    Worry: Not on file    Inability: Not on file  . Transportation needs    Medical: Not on file    Non-medical: Not on file  Tobacco Use  . Smoking status: Never Smoker  .  Smokeless tobacco: Never Used  Substance and Sexual Activity  . Alcohol use: Yes    Comment: occasional  . Drug use: No  . Sexual activity: Yes    Birth control/protection: I.U.D.  Lifestyle  . Physical activity    Days per week: Not on file    Minutes per session: Not on file  . Stress: Not on file  Relationships  . Social Musicianconnections    Talks on phone: Not on file    Gets together: Not on file    Attends religious service: Not on file    Active member of club or organization: Not on file    Attends meetings of clubs or organizations: Not on file    Relationship status: Not on file  . Intimate partner violence    Fear of current or ex partner: Not on file    Emotionally abused: Not on file    Physically abused: Not on file    Forced sexual activity: Not on file  Other Topics Concern  . Not on file  Social History Narrative   ** Merged History Encounter **        Outpatient Medications Prior to Visit  Medication Sig Dispense Refill  . ibuprofen (ADVIL,MOTRIN) 600 MG tablet Take 1 tablet (600 mg total) by mouth every 6 (six) hours as needed.  30 tablet 0  . Multiple Vitamin (MULTIVITAMIN) tablet Take 1 tablet by mouth daily.    . norgestimate-ethinyl estradiol (ORTHO-CYCLEN,SPRINTEC,PREVIFEM) 0.25-35 MG-MCG tablet Take 1 tablet by mouth daily. 3 Package 3   No facility-administered medications prior to visit.     No Known Allergies  ROS Per hpi     Objective:    Physical Exam  Constitutional: She is oriented to person, place, and time. She appears well-developed and well-nourished. No distress.  Cardiovascular: Normal rate and regular rhythm.  Pulmonary/Chest: Effort normal. No respiratory distress.  Abdominal: There is abdominal tenderness in the suprapubic area. There is no CVA tenderness.  Neurological: She is alert and oriented to person, place, and time.  Skin: Skin is warm and dry. No rash noted. She is not diaphoretic. No erythema. No pallor.  Psychiatric:  She has a normal mood and affect. Her behavior is normal. Judgment and thought content normal.    BP 111/68   Pulse 80   Temp 98 F (36.7 C) (Oral)   Resp 16   Ht 4' 11.84" (1.52 m)   Wt 118 lb 12.8 oz (53.9 kg)   LMP 11/06/2018   SpO2 100%   BMI 23.32 kg/m  Wt Readings from Last 3 Encounters:  11/30/18 118 lb 12.8 oz (53.9 kg)  12/26/16 127 lb 3.2 oz (57.7 kg)  12/03/16 127 lb (57.6 kg)    Health Maintenance Due  Topic Date Due  . HEMOGLOBIN A1C  1984-12-19  . PNEUMOCOCCAL POLYSACCHARIDE VACCINE AGE 50-64 HIGH RISK  06/22/1986  . FOOT EXAM  06/22/1994  . OPHTHALMOLOGY EXAM  06/22/1994  . URINE MICROALBUMIN  06/22/1994  . PAP SMEAR-Modifier  04/29/2018    There are no preventive care reminders to display for this patient.   No results found for: TSH Lab Results  Component Value Date   WBC 13.5 (H) 10/17/2015   HGB 11.0 (L) 10/17/2015   HCT 30.7 (L) 10/17/2015   MCV 87.5 10/17/2015   PLT 168 10/17/2015   Lab Results  Component Value Date   NA 133 (L) 10/15/2015   K 3.8 10/15/2015   CO2 18 (L) 10/15/2015   GLUCOSE 78 10/15/2015   BUN 16 10/15/2015   CREATININE 0.51 10/15/2015   CALCIUM 8.7 (L) 10/15/2015   ANIONGAP 7 10/15/2015   No results found for: CHOL No results found for: HDL No results found for: LDLCALC No results found for: TRIG No results found for: CHOLHDL No results found for: FWYO3Z     Assessment & Plan:   Problem List Items Addressed This Visit    None    Visit Diagnoses    Dysuria    -  Primary   Relevant Medications   phenazopyridine (PYRIDIUM) 95 MG tablet   nitrofurantoin, macrocrystal-monohydrate, (MACROBID) 100 MG capsule   Other Relevant Orders   POCT urinalysis dipstick (Completed)   POCT Microscopic Urinalysis ([facility]) (Completed)       Meds ordered this encounter  Medications  . DISCONTD: nitrofurantoin, macrocrystal-monohydrate, (MACROBID) 100 MG capsule    Sig: Take 1 capsule (100 mg total) by mouth 2 (two) times  daily for 5 days.    Dispense:  10 capsule    Refill:  0    Order Specific Question:   Supervising Provider    Answer:   Collie Siad A [id number]  . DISCONTD: phenazopyridine (PYRIDIUM) 95 MG tablet    Sig: Take 1 tablet (95 mg total) by mouth 3 (three) times daily as needed for pain.  Dispense:  10 tablet    Refill:  0    Order Specific Question:   Supervising Provider    Answer:   Delia Chimes A [id number]  . phenazopyridine (PYRIDIUM) 95 MG tablet    Sig: Take 1 tablet (95 mg total) by mouth 3 (three) times daily as needed for pain.    Dispense:  10 tablet    Refill:  0    Order Specific Question:   Supervising Provider    Answer:   Delia Chimes A [id number]  . nitrofurantoin, macrocrystal-monohydrate, (MACROBID) 100 MG capsule    Sig: Take 1 capsule (100 mg total) by mouth 2 (two) times daily for 5 days.    Dispense:  10 capsule    Refill:  0    Order Specific Question:   Supervising Provider    Answer:   Delia Chimes A [id number]    PLAN  UA dip shows many leuks and some RBCs. Will treat as UTI.   Macrobid 100mg  PO bid for 5 days  Azo for symptom relief  Return to clinic if symptoms worsen or fail to improve  Patient encouraged to call clinic with any questions, comments, or concerns.   Maximiano Coss, NP

## 2019-02-16 ENCOUNTER — Encounter: Payer: Self-pay | Admitting: Registered Nurse

## 2019-02-16 ENCOUNTER — Telehealth (INDEPENDENT_AMBULATORY_CARE_PROVIDER_SITE_OTHER): Payer: BC Managed Care – PPO | Admitting: Registered Nurse

## 2019-02-16 ENCOUNTER — Other Ambulatory Visit: Payer: Self-pay

## 2019-02-16 VITALS — BP 105/73 | Ht <= 58 in | Wt 114.0 lb

## 2019-02-16 DIAGNOSIS — R112 Nausea with vomiting, unspecified: Secondary | ICD-10-CM

## 2019-02-16 DIAGNOSIS — H8110 Benign paroxysmal vertigo, unspecified ear: Secondary | ICD-10-CM

## 2019-02-16 MED ORDER — MECLIZINE HCL 25 MG PO TABS
25.0000 mg | ORAL_TABLET | Freq: Three times a day (TID) | ORAL | 2 refills | Status: DC | PRN
Start: 2019-02-16 — End: 2022-07-02

## 2019-02-16 MED ORDER — ONDANSETRON 4 MG PO TBDP: 4.0000 mg | ORAL_TABLET | Freq: Three times a day (TID) | ORAL | 0 refills | Status: AC | PRN

## 2019-02-16 NOTE — Progress Notes (Signed)
Telemedicine Encounter- SOAP NOTE Established Patient  This telephone encounter was conducted with the patient's (or proxy's) verbal consent via audio telecommunications: yes  Patient was instructed to have this encounter in a suitably private space; and to only have persons present to whom they give permission to participate. In addition, patient identity was confirmed by use of name plus two identifiers (DOB and address).  I discussed the limitations, risks, security and privacy concerns of performing an evaluation and management service by telephone and the availability of in person appointments. I also discussed with the patient that there may be a patient responsible charge related to this service. The patient expressed understanding and agreed to proceed.  I spent a total of 14 minutes talking with the patient or their proxy.  Chief Complaint  Patient presents with  . Dizziness    Pt stated---vomiting, hard to focus, cold chills with sweat, dizzy,  when laying down, light head, headache----1 month    Subjective   Samantha Suarez is a 33 y.o. established patient. Telephone visit today for dizziness  HPI Onset was around 1 month ago. Somewhat sudden onset. Dizziness, nausea, trouble focusing, particularly when changing position or moving around quickly. Some chills and cold sweats. Some tension type headache. Recently, started vomiting this morning when moving around quickly. Has subsided since.  Denies diarrhea, cough, shob, fatigue, fever, weakness. Does not have a recollection of a head injury leading into this event.   Patient Active Problem List   Diagnosis Date Noted  . Admission for sterilization 02/03/2018  . 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 History of gestational diabetes 12/04/2015  . Tinea corporis 11/21/2015  . Atypical squamous cell changes of undetermined significance (ASCUS) on cervical cytology with positive high risk human papilloma virus (HPV) 07/19/2013    Past  Medical History:  Diagnosis Date  . Abnormal Pap smear of cervix 05/13/2013   ASC-US +HPV  . Gestational diabetes   . 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 ibuprofen (ADVIL,MOTRIN) 600 MG tablet Take 1 tablet (600 mg total) by mouth every 6 (six) hours as needed. 30 tablet 0  . Multiple Vitamin (MULTIVITAMIN) tablet Take 1 tablet by mouth daily.    . meclizine (ANTIVERT) 25 MG tablet Take 1 tablet (25 mg total) by mouth 3 (three) times daily as needed for dizziness. 30 tablet 2  . ondansetron (ZOFRAN-ODT) 4 MG disintegrating tablet Take 1 tablet (4 mg total) by mouth every 8 (eight) hours as needed for nausea or vomiting. 30 tablet 0   No current facility-administered medications for this visit.    No Known Allergies  Social History   Socioeconomic History  . Marital status: Single    Spouse name: Not on file  . Number of children: Not on file  . Years of education: Not on file  . Highest education level: Not on file  Occupational History  . Not on file  Tobacco Use  . Smoking status: Never Smoker  . Smokeless tobacco: Never Used  Substance and Sexual Activity  . Alcohol use: Yes    Comment: occasional  . Drug use: No  . Sexual activity: Yes    Birth control/protection: Condom  Other Topics Concern  . Not on file  Social History Narrative   ** Merged History Encounter **       Social Determinants of Health   Financial Resource Strain:   . Difficulty of  Paying Living Expenses: Not on file  Food Insecurity:   . Worried About Charity fundraiser in the Last Year: Not on file  . Ran Out of Food in the Last Year: Not on file  Transportation Needs:   . Lack of Transportation (Medical): Not on file  . Lack of Transportation (Non-Medical): Not on file  Physical Activity:   . Days of Exercise per Week: Not on file  . Minutes of Exercise per Session: Not on file  Stress:   . Feeling  of Stress : Not on file  Social Connections:   . Frequency of Communication with Friends and Family: Not on file  . Frequency of Social Gatherings with Friends and Family: Not on file  . Attends Religious Services: Not on file  . Active Member of Clubs or Organizations: Not on file  . Attends Archivist Meetings: Not on file  . Marital Status: Not on file  Intimate Partner Violence:   . Fear of Current or Ex-Partner: Not on file  . Emotionally Abused: Not on file  . Physically Abused: Not on file  . Sexually Abused: Not on file    Review of Systems  Constitutional: Positive for chills and diaphoresis. Negative for malaise/fatigue.  HENT: Negative.   Eyes: Negative.   Respiratory: Negative.   Cardiovascular: Negative.   Gastrointestinal: Positive for nausea and vomiting. Negative for diarrhea.  Genitourinary: Negative.   Musculoskeletal: Negative.   Skin: Negative.   Neurological: Positive for dizziness and headaches. Negative for sensory change, focal weakness and weakness.  Endo/Heme/Allergies: Negative.   Psychiatric/Behavioral: Negative.   All other systems reviewed and are negative.   Objective   Vitals as reported by the patient: Today's Vitals   02/16/19 1016  BP: 105/73  Weight: 114 lb (51.7 kg)  Height: 4\' 9"  (1.448 m)    Samantha Suarez was seen today for dizziness.  Diagnoses and all orders for this visit:  Non-intractable vomiting with nausea, unspecified vomiting type -     ondansetron (ZOFRAN-ODT) 4 MG disintegrating tablet; Take 1 tablet (4 mg total) by mouth every 8 (eight) hours as needed for nausea or vomiting. -     meclizine (ANTIVERT) 25 MG tablet; Take 1 tablet (25 mg total) by mouth 3 (three) times daily as needed for dizziness.  Benign paroxysmal positional vertigo, unspecified laterality -     ondansetron (ZOFRAN-ODT) 4 MG disintegrating tablet; Take 1 tablet (4 mg total) by mouth every 8 (eight) hours as needed for nausea or vomiting. -      meclizine (ANTIVERT) 25 MG tablet; Take 1 tablet (25 mg total) by mouth 3 (three) times daily as needed for dizziness.   PLAN  Suspect vertigo based on symptoms and history  Will treat with ondansetron and meclizine  Strongly suggest COVID test as well, given resources to make that appointment  If symptoms worsen or fail to improve, call office  Patient encouraged to call clinic with any questions, comments, or concerns.   I discussed the assessment and treatment plan with the patient. The patient was provided an opportunity to ask questions and all were answered. The patient agreed with the plan and demonstrated an understanding of the instructions.   The patient was advised to call back or seek an in-person evaluation if the symptoms worsen or if the condition fails to improve as anticipated.  I provided 14 minutes of non-face-to-face time during this encounter.  Maximiano Coss, NP  Primary Care at [facility]

## 2019-06-04 ENCOUNTER — Ambulatory Visit: Payer: Medicaid Other | Attending: Internal Medicine

## 2019-06-04 DIAGNOSIS — Z23 Encounter for immunization: Secondary | ICD-10-CM

## 2019-06-04 NOTE — Progress Notes (Signed)
   Covid-19 Vaccination Clinic  Name:  Lakendra Helling    MRN: [id number] DOB: Aug 27, 1985  06/04/2019  Ms. Bauer was observed post Covid-19 immunization for 15 minutes without incident. She was provided with Vaccine Information Sheet and instruction to access the V-Safe system.   Ms. Ishler was instructed to call 911 with any severe reactions post vaccine: Marland Kitchen Difficulty breathing  . Swelling of face and throat  . A fast heartbeat  . A bad rash all over body  . Dizziness and weakness   Immunizations Administered    Name Date Dose VIS Date Route   Pfizer COVID-19 Vaccine 06/04/2019  8:56 AM 0.3 mL 02/11/2019 Intramuscular   Manufacturer: [employer], [employer]   Lot: [id number]   NDC: [address]-6

## 2019-06-28 ENCOUNTER — Ambulatory Visit: Payer: Medicaid Other | Attending: Internal Medicine

## 2019-06-28 DIAGNOSIS — Z23 Encounter for immunization: Secondary | ICD-10-CM

## 2019-06-28 NOTE — Progress Notes (Signed)
   Covid-19 Vaccination Clinic  Name:  Kortney Potvin    MRN: [id number] DOB: 09-15-1985  06/28/2019  Ms. Bronkema was observed post Covid-19 immunization for 15 minutes without incident. She was provided with Vaccine Information Sheet and instruction to access the V-Safe system.   Ms. Demo was instructed to call 911 with any severe reactions post vaccine: Marland Kitchen Difficulty breathing  . Swelling of face and throat  . A fast heartbeat  . A bad rash all over body  . Dizziness and weakness   Immunizations Administered    Name Date Dose VIS Date Route   Pfizer COVID-19 Vaccine 06/28/2019  4:11 PM 0.3 mL 04/27/2018 Intramuscular   Manufacturer: [employer], [employer]   Lot: [id number]   NDC: [address]-1

## 2022-01-03 ENCOUNTER — Ambulatory Visit (HOSPITAL_COMMUNITY)
Admission: EM | Admit: 2022-01-03 | Discharge: 2022-01-03 | Disposition: A | Discharge status: Home / Self Care | Payer: Medicaid Other | Attending: Physician Assistant | Admitting: Physician Assistant

## 2022-01-03 ENCOUNTER — Encounter (HOSPITAL_COMMUNITY): Payer: Self-pay | Admitting: Physician Assistant

## 2022-01-03 DIAGNOSIS — L02416 Cutaneous abscess of left lower limb: Secondary | ICD-10-CM

## 2022-01-03 DIAGNOSIS — L03116 Cellulitis of left lower limb: Secondary | ICD-10-CM

## 2022-01-03 MED ORDER — IBUPROFEN 800 MG PO TABS: 800.0000 mg | ORAL_TABLET | Freq: Three times a day (TID) | ORAL | 0 refills | Status: DC

## 2022-01-03 MED ORDER — SULFAMETHOXAZOLE-TRIMETHOPRIM 800-160 MG PO TABS: 1.0000 | ORAL_TABLET | Freq: Two times a day (BID) | ORAL | 0 refills | Status: DC

## 2022-01-03 MED ORDER — SULFAMETHOXAZOLE-TRIMETHOPRIM 800-160 MG PO TABS: 1.0000 | ORAL_TABLET | Freq: Two times a day (BID) | ORAL | 0 refills | Status: AC

## 2022-01-03 MED ORDER — IBUPROFEN 800 MG PO TABS: 800.0000 mg | ORAL_TABLET | Freq: Three times a day (TID) | ORAL | 0 refills | Status: AC

## 2022-01-03 MED ORDER — MUPIROCIN 2 % EX OINT: 1.0000 | TOPICAL_OINTMENT | Freq: Two times a day (BID) | CUTANEOUS | 0 refills | Status: AC

## 2022-01-03 MED ORDER — MUPIROCIN 2 % EX OINT: 1.0000 | TOPICAL_OINTMENT | Freq: Two times a day (BID) | CUTANEOUS | 0 refills | Status: DC

## 2022-01-03 NOTE — ED Triage Notes (Signed)
Here for possible abscess on her buttocks. Pt reports bump started 1 week ago. Pt denies any drainage at this time.

## 2022-01-03 NOTE — ED Provider Notes (Signed)
[place]    CSN: [id number] Arrival date & time: 01/03/22  1653      History   Chief Complaint Chief Complaint  Patient presents with   Abscess    HPI Samantha Suarez is a 36 y.o. female.   Patient presents today with a weeklong history of enlarging lesion on her left thigh.  Reports that initially this was a pustule that she was able to express some clear drainage from.  Since then the redness has continued to spread prompting reevaluation.  She reports area is painful with pain rated 4 on a 0-10 pain scale, worse with palpation or touch of clothing.  She denies any recent antibiotics.  Denies history of recurrent skin infections or MRSA.  She has not tried any over-the-counter medication for symptom management.  She is confident she is not pregnant.    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   History of gestational diabetes 12/04/2015   Tinea corporis 11/21/2015   Atypical squamous cell changes of undetermined significance (ASCUS) on cervical cytology with positive high risk human papilloma virus (HPV) 07/19/2013    Past Surgical History:  Procedure Laterality Date   CESAREAN SECTION N/A 10/12/2012   Procedure: CESAREAN SECTION;  Surgeon: Sharene Butters, MD;  Location: [facility];  Service: Obstetrics;  Laterality: N/A;   CESAREAN SECTION N/A 10/16/2015   Procedure: CESAREAN SECTION;  Surgeon: Truett Mainland, DO;  Location: [facility];  Service: Obstetrics;  Laterality: N/A;   HYSTEROSCOPY N/A 12/03/2016   Procedure: HYSTEROSCOPY & IUD Removal;  Surgeon: Aletha Halim, MD;  Location: [facility];  Service: Gynecology;  Laterality: N/A;    OB History     Gravida  3   Para   2   Term  2   Preterm  0   AB  1   Living  2      SAB  1   IAB  0   Ectopic  0   Multiple  0   Live Births  2            Home Medications    Prior to Admission medications   Medication Sig Start Date End Date Taking? Authorizing Provider  ibuprofen (ADVIL) 800 MG tablet Take 1 tablet (800 mg total) by mouth 3 (three) times daily. 01/03/22   Draeden Kellman, Derry Skill, PA-C  meclizine (ANTIVERT) 25 MG tablet Take 1 tablet (25 mg total) by mouth 3 (three) times daily as needed for dizziness. 02/16/19   Maximiano Coss, NP  Multiple Vitamin (MULTIVITAMIN) tablet Take 1 tablet by mouth daily.    [provider]  mupirocin ointment (BACTROBAN) 2 % Apply 1 Application topically 2 (two) times daily. 01/03/22   Jaylee Lantry, Derry Skill, PA-C  ondansetron (ZOFRAN-ODT) 4 MG disintegrating tablet Take 1 tablet (4 mg total) by mouth every 8 (eight) hours as needed for nausea or vomiting. 02/16/19   Maximiano Coss, NP  sulfamethoxazole-trimethoprim (BACTRIM DS) 800-160 MG tablet Take 1 tablet by mouth 2 (two) times daily for 7 days. 01/03/22 01/10/22  Aryav Wimberly, Derry Skill, PA-C    Family History Family History  Problem Relation Age of Onset   Hyperlipidemia Mother  Social History Social History   Tobacco Use   Smoking status: Never   Smokeless tobacco: Never  Vaping Use   Vaping Use: Never used  Substance Use Topics   Alcohol use: Yes    Comment: occasional   Drug use: No     Allergies   Patient has no known allergies.   Review of Systems Review of Systems  Constitutional:  Negative for activity change, appetite change, fatigue and fever.  Gastrointestinal:  Negative for abdominal pain, diarrhea, nausea and vomiting.  Musculoskeletal:  Negative for arthralgias and myalgias.  Skin:  Positive for color change and wound.     Physical Exam Triage Vital Signs ED Triage Vitals  Enc Vitals Group     BP 01/03/22 1735 98/64     Pulse Rate 01/03/22 1735 66     Resp 01/03/22  1741 16     Temp 01/03/22 1735 98 F (36.7 C)     Temp Source 01/03/22 1735 Oral     SpO2 01/03/22 1735 99 %     Weight --      Height --      Head Circumference --      Peak Flow --      Pain Score --      Pain Loc --      Pain Edu? --      Excl. in GC? --    No data found.  Updated Vital Signs BP 98/64 (BP Location: Left Arm)   Pulse 66   Temp 98 F (36.7 C) (Oral)   Resp 16   SpO2 99%   Visual Acuity Right Eye Distance:   Left Eye Distance:   Bilateral Distance:    Right Eye Near:   Left Eye Near:    Bilateral Near:     Physical Exam Vitals reviewed.  Constitutional:      General: She is awake. She is not in acute distress.    Appearance: Normal appearance. She is well-developed. She is not ill-appearing.     Comments: Very pleasant female appears stated age in no acute distress sitting comfortably in exam room  HENT:     Head: Normocephalic and atraumatic.  Cardiovascular:     Rate and Rhythm: Normal rate and regular rhythm.     Heart sounds: Normal heart sounds, S1 normal and S2 normal. No murmur heard. Pulmonary:     Effort: Pulmonary effort is normal.     Breath sounds: Normal breath sounds. No wheezing, rhonchi or rales.     Comments: Clear to auscultation bilaterally Abdominal:     General: Bowel sounds are normal.     Palpations: Abdomen is soft.     Tenderness: There is no abdominal tenderness. There is no right CVA tenderness, left CVA tenderness, guarding or rebound.  Skin:    Findings: Erythema present.     Comments: 2 cm x 2 cm indurated lesion noted right medial thigh with surrounding erythema.  No fluctuance, bleeding, drainage noted.  Psychiatric:        Behavior: Behavior is cooperative.      UC Treatments / Results  Labs (all labs ordered are listed, but only abnormal results are displayed) Labs Reviewed - No data to display  EKG   Radiology No results found.  Procedures Procedures (including critical care  time)  Medications Ordered in UC Medications - No data to display  Initial Impression / Assessment and Plan / UC Course  I have reviewed the triage vital signs and  the nursing notes.  Pertinent labs & imaging results that were available during my care of the patient were reviewed by me and considered in my medical decision making (see chart for details).     No indication for I&D as there is no significant fluctuance on exam.  Patient was started on Bactrim DS.  Discussed that if she develops any rash or lesions she stop the medication to be seen immediately.  Recommended warm compresses and keeping area clean.  She is to apply Bactroban twice daily.  She was prescribed ibuprofen 800 mg for pain with instruction not to take NSAIDs with this medication due to risk of GI bleeding.  Recommended that she demarcate area of erythema and monitor for spread when she has been on antibiotics.  Discussed that if she has any persistent/worsening symptoms including spread of erythema, fever, nausea, vomiting, increased pain she needs to go to the emergency room.  Strict return precautions given.  Final Clinical Impressions(s) / UC Diagnoses   Final diagnoses:  Cellulitis and abscess of left leg     Discharge Instructions      Start Bactrim DS twice daily for 7 days.  If develop any rash or oral lesions stop the medication to be seen immediately.  Demarcate the area of redness and if this continues to spread you need to be seen immediately.  Keep the area clean with soap and water and apply mupirocin twice daily.  If you develop any fever, nausea, vomiting, spread of redness, increased pain you need to go to the emergency room.     ED Prescriptions     Medication Sig Dispense Auth. Provider   sulfamethoxazole-trimethoprim (BACTRIM DS) 800-160 MG tablet  (Status: Discontinued) Take 1 tablet by mouth 2 (two) times daily for 7 days. 14 tablet Britian Jentz K, PA-C   mupirocin ointment (BACTROBAN) 2 %   (Status: Discontinued) Apply 1 Application topically 2 (two) times daily. 22 g Shawnise Peterkin K, PA-C   ibuprofen (ADVIL) 800 MG tablet  (Status: Discontinued) Take 1 tablet (800 mg total) by mouth 3 (three) times daily. 15 tablet Delailah Spieth K, PA-C   ibuprofen (ADVIL) 800 MG tablet Take 1 tablet (800 mg total) by mouth 3 (three) times daily. 15 tablet Sharnee Douglass K, PA-C   mupirocin ointment (BACTROBAN) 2 % Apply 1 Application topically 2 (two) times daily. 22 g Giavonni Fonder K, PA-C   sulfamethoxazole-trimethoprim (BACTRIM DS) 800-160 MG tablet Take 1 tablet by mouth 2 (two) times daily for 7 days. 14 tablet Rashi Granier, Noberto Retort, PA-C      PDMP not reviewed this encounter.   Jeani Hawking, PA-C 01/03/22 1827

## 2022-01-03 NOTE — Discharge Instructions (Addendum)
Start Bactrim DS twice daily for 7 days.  If develop any rash or oral lesions stop the medication to be seen immediately.  Demarcate the area of redness and if this continues to spread you need to be seen immediately.  Keep the area clean with soap and water and apply mupirocin twice daily.  If you develop any fever, nausea, vomiting, spread of redness, increased pain you need to go to the emergency room.

## 2022-07-02 ENCOUNTER — Ambulatory Visit
Admission: EM | Admit: 2022-07-02 | Discharge: 2022-07-02 | Disposition: A | Discharge status: Home / Self Care | Payer: Self-pay

## 2022-07-02 DIAGNOSIS — J018 Other acute sinusitis: Secondary | ICD-10-CM

## 2022-07-02 MED ORDER — CETIRIZINE HCL 10 MG PO TABS: 10.0000 mg | ORAL_TABLET | Freq: Every day | ORAL | 0 refills | Status: AC

## 2022-07-02 MED ORDER — AMOXICILLIN 875 MG PO TABS: 875.0000 mg | ORAL_TABLET | Freq: Two times a day (BID) | ORAL | 0 refills | Status: DC

## 2022-07-02 MED ORDER — PSEUDOEPHEDRINE HCL 30 MG PO TABS: 30.0000 mg | ORAL_TABLET | Freq: Three times a day (TID) | ORAL | 0 refills | Status: DC | PRN

## 2022-07-02 MED ORDER — MECLIZINE HCL 12.5 MG PO TABS: 12.5000 mg | ORAL_TABLET | Freq: Three times a day (TID) | ORAL | 0 refills | Status: DC | PRN

## 2022-07-02 NOTE — ED Triage Notes (Signed)
Pt reports headache and lightheadedness x 3 weeks. Tylebol gives temporary relief.

## 2022-07-02 NOTE — ED Provider Notes (Signed)
[facility]  Note:  This document was prepared using Conservation officer, historic buildings and may include unintentional dictation errors.  MRN: [id number] DOB: December 23, 1985  Subjective:   Samantha Suarez is a 37 y.o. female presenting for 3-week history of persistent frontal headaches, heaviness about her eyes, fatigue, ear fullness and ringing.  Has a history of vertigo but feels like this is different.  Thinks it may have been allergies but is not sure.  Does not have runny stuffy nose, confusion, neck pain, neck stiffness, sore throat, cough.  Tries to hydrate well.  No smoking of any kind including cigarettes, cigars, vaping, marijuana use.    No current facility-administered medications for this encounter.  Current Outpatient Medications:    acetaminophen (TYLENOL) 500 MG tablet, Take 500 mg by mouth every 6 (six) hours as needed., Disp: , Rfl:    ibuprofen (ADVIL) 800 MG tablet, Take 1 tablet (800 mg total) by mouth 3 (three) times daily., Disp: 15 tablet, Rfl: 0   meclizine (ANTIVERT) 25 MG tablet, Take 1 tablet (25 mg total) by mouth 3 (three) times daily as needed for dizziness., Disp: 30 tablet, Rfl: 2   Multiple Vitamin (MULTIVITAMIN) tablet, Take 1 tablet by mouth daily., Disp: , Rfl:    mupirocin ointment (BACTROBAN) 2 %, Apply 1 Application topically 2 (two) times daily., Disp: 22 g, Rfl: 0   ondansetron (ZOFRAN-ODT) 4 MG disintegrating tablet, Take 1 tablet (4 mg total) by mouth every 8 (eight) hours as needed for nausea or vomiting., Disp: 30 tablet, Rfl: 0   No Known Allergies  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   Seen on 12/2016 hysteroscopy     Past Surgical History:  Procedure Laterality Date   CESAREAN SECTION N/A 10/12/2012   Procedure: CESAREAN SECTION;  Surgeon: Mickel Baas, MD;  Location: [facility];  Service: Obstetrics;   Laterality: N/A;   CESAREAN SECTION N/A 10/16/2015   Procedure: CESAREAN SECTION;  Surgeon: Levie Heritage, DO;  Location: [facility];  Service: Obstetrics;  Laterality: N/A;   HYSTEROSCOPY N/A 12/03/2016   Procedure: HYSTEROSCOPY & IUD Removal;  Surgeon: [place] Bing, MD;  Location: [facility];  Service: Gynecology;  Laterality: N/A;    Family History  Problem Relation Age of Onset   Hyperlipidemia Mother     Social History   Tobacco Use   Smoking status: Never   Smokeless tobacco: Never  Vaping Use   Vaping Use: Never used  Substance Use Topics   Alcohol use: Yes    Comment: occasional   Drug use: No    ROS   Objective:   Vitals: BP 106/71 (BP Location: Left Arm)   Pulse 70   Temp 98.4 F (36.9 C) (Oral)   Resp 18   LMP  (Within Weeks) Comment: 1 week  SpO2 98%   Physical Exam Constitutional:      General: She is not in acute distress.    Appearance: Normal appearance. She is well-developed and normal weight. She is not ill-appearing, toxic-appearing or diaphoretic.  HENT:     Head: Normocephalic and atraumatic.     Right Ear: Tympanic membrane, ear canal and external ear normal. No drainage or tenderness. No middle ear effusion. There is no impacted cerumen. Tympanic membrane is not erythematous or bulging.     Left Ear: Tympanic membrane, ear  canal and external ear normal. No drainage or tenderness.  No middle ear effusion. There is no impacted cerumen. Tympanic membrane is not erythematous or bulging.     Nose: No congestion or rhinorrhea.     Mouth/Throat:     Mouth: Mucous membranes are moist. No oral lesions.     Pharynx: No pharyngeal swelling, oropharyngeal exudate, posterior oropharyngeal erythema or uvula swelling.     Tonsils: No tonsillar exudate or tonsillar abscesses.  Eyes:     General: No scleral icterus.       Right eye: No discharge.        Left eye: No discharge.     Extraocular Movements: Extraocular movements  intact.     Right eye: Normal extraocular motion.     Left eye: Normal extraocular motion.     Conjunctiva/sclera: Conjunctivae normal.  Cardiovascular:     Rate and Rhythm: Normal rate.  Pulmonary:     Effort: Pulmonary effort is normal.  Musculoskeletal:     Cervical back: Normal range of motion and neck supple.  Lymphadenopathy:     Cervical: No cervical adenopathy.  Skin:    General: Skin is warm and dry.  Neurological:     General: No focal deficit present.     Mental Status: She is alert and oriented to person, place, and time.     Cranial Nerves: No cranial nerve deficit.     Motor: No weakness.     Coordination: Coordination normal.     Gait: Gait normal.     Comments: Negative Kernig and Brudzinski.  Psychiatric:        Mood and Affect: Mood normal.        Behavior: Behavior normal.     Assessment and Plan :   PDMP not reviewed this encounter.  1. Other acute sinusitis, recurrence not specified    No signs of an acute encephalopathy.  Overall, the symptoms and exam are consistent with a sinus infection.  Will start empiric treatment for sinusitis with amoxicillin.  Recommended supportive care otherwise. Counseled patient on potential for adverse effects with medications prescribed/recommended today, ER and return-to-clinic precautions discussed, patient verbalized understanding.    Wallis Bamberg, [place] 07/02/22 1955

## 2022-07-02 NOTE — Discharge Instructions (Addendum)
We will manage this as a sinus infection with amoxicillin. For sore throat or cough try using a honey-based tea. Use 3 teaspoons of honey with juice squeezed from half lemon. Place shaved pieces of ginger into 1/2-1 cup of water and warm over stove top. Then mix the ingredients and repeat every 4 hours as needed. Please take ibuprofen 600mg  every 6 hours with food alternating with OR taken together with Tylenol 500mg -650mg  every 6 hours for throat pain, fevers, aches and pains. Hydrate very well with at least 2 liters of water. Eat light meals such as soups (chicken and noodles, vegetable, chicken and wild rice).  Do not eat foods that you are allergic to.  Taking an antihistamine like Zyrtec can help against postnasal drainage, sinus congestion which can cause sinus pain, sinus headaches, throat pain, painful swallowing, coughing.  You can take this together with pseudoephedrine (Sudafed) at a dose of 30 mg 3 times a day or twice daily as needed for the same kind of nasal drip, congestion.  Use meclizine 12.5mg  as needed.

## 2022-07-23 ENCOUNTER — Ambulatory Visit
Admission: EM | Admit: 2022-07-23 | Discharge: 2022-07-23 | Disposition: A | Discharge status: Home / Self Care | Payer: Medicaid Other | Attending: Nurse Practitioner | Admitting: Nurse Practitioner

## 2022-07-23 DIAGNOSIS — R42 Dizziness and giddiness: Secondary | ICD-10-CM

## 2022-07-23 MED ORDER — MECLIZINE HCL 25 MG PO TABS: 25.0000 mg | ORAL_TABLET | Freq: Three times a day (TID) | ORAL | 0 refills | Status: AC | PRN

## 2022-07-23 NOTE — ED Provider Notes (Signed)
[facility]    CSN: [id number] Arrival date & time: 07/23/22  1646      History   Chief Complaint No chief complaint on file.   HPI Samantha Suarez is a 37 y.o. female presents for evaluation of vertigo symptoms.  Patient has a history of vertigo and reports over the past week she has been having intermittent dizziness that she describes as the room is spinning that is worse with turning of her head or laying down.  She has intermittent frontal headaches with this but they resolve without treatment.  Denies they are the worst headaches of her life.  Had a little bit of nausea last night but otherwise no nausea vomiting, visual changes, syncope.  Denies any cough or sinus congestion/pressure or fever/chills.  No neck pain.  She was seen in urgent care on May 1 for sinus pressure/pain with associated vertigo symptoms.  She was given antibiotics and her sinus issues resolved.  She was also prescribed 12.5 mg of meclizine which she has been taking for her current symptoms which she states does help but she is almost out.  No other concerns at this time.  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;  Service: Obstetrics;  Laterality: N/A;   HYSTEROSCOPY N/A 12/03/2016   Procedure: HYSTEROSCOPY & IUD Removal;  Surgeon: [place] Bing, MD;  Location: [facility];  Service: Gynecology;  Laterality: N/A;    OB History     Gravida  3   Para  2   Term  2   Preterm  0   AB  1   Living  2      SAB  1   IAB  0   Ectopic  0   Multiple  0   Live Births  2            Home Medications    Prior to Admission medications   Medication Sig Start Date End Date Taking? Authorizing Provider  acetaminophen (TYLENOL) 500 MG tablet Take 500 mg by mouth every 6 (six) hours as needed.    [provider]  amoxicillin (AMOXIL) 875 MG tablet Take 1 tablet (875 mg total) by mouth 2 (two) times daily. 07/02/22   Wallis Bamberg, PA-C  cetirizine (ZYRTEC ALLERGY) 10 MG tablet Take 1 tablet (10 mg total) by mouth daily. 07/02/22   Wallis Bamberg, PA-C  ibuprofen (ADVIL) 800 MG tablet Take 1 tablet (800 mg total) by mouth 3 (three) times daily. 01/03/22   Raspet, Noberto Retort, PA-C  meclizine (ANTIVERT) 25 MG tablet Take 1 tablet (25 mg total) by mouth 3 (three) times daily as needed for  dizziness. 07/23/22  Yes Radford Pax, NP  Multiple Vitamin (MULTIVITAMIN) tablet Take 1 tablet by mouth daily.    [provider]  mupirocin ointment (BACTROBAN) 2 % Apply 1 Application topically 2 (two) times daily. 01/03/22   Raspet, Noberto Retort, PA-C  ondansetron (ZOFRAN-ODT) 4 MG disintegrating tablet Take 1 tablet (4 mg total) by mouth every 8 (eight) hours as needed for nausea or vomiting. 02/16/19   Janeece Agee, NP  pseudoephedrine (SUDAFED) 30 MG tablet Take 1 tablet (30 mg total) by mouth every 8 (eight) hours as needed for congestion. 07/02/22   Wallis Bamberg, PA-C    Family History Family History  Problem Relation Age of Onset   Hyperlipidemia Mother     Social History Social History   Tobacco Use   Smoking status: Never   Smokeless tobacco:  Never  Vaping Use   Vaping Use: Never used  Substance Use Topics   Alcohol use: Yes    Comment: occasional   Drug use: No     Allergies   Patient has no known allergies.   Review of Systems Review of Systems  Neurological:  Positive for dizziness.     Physical Exam Triage Vital Signs ED Triage Vitals  Enc Vitals Group     BP 07/23/22 1735 94/65     Pulse Rate 07/23/22 1735 71     Resp 07/23/22 1735 17     Temp 07/23/22 1735 98.3 F (36.8 C)     Temp Source 07/23/22 1735 Oral     SpO2 07/23/22 1735 98 %     Weight --      Height --      Head Circumference --      Peak Flow --      Pain Score 07/23/22 1734 0     Pain Loc --      Pain Edu? --      Excl. in GC? --    No data found.  Updated Vital Signs BP 103/70 (BP Location: Right Arm)   Pulse 71   Temp 98.3 F (36.8 C) (Oral)   Resp 17   LMP 07/20/2022 (Exact Date)   SpO2 98%   Visual Acuity Right Eye Distance:   Left Eye Distance:   Bilateral Distance:    Right Eye Near:   Left Eye Near:    Bilateral Near:     Physical Exam Vitals and nursing note reviewed.  Constitutional:      General: She is not in acute distress.    Appearance: Normal appearance. She is not ill-appearing, toxic-appearing or diaphoretic.  HENT:     Head: Normocephalic and atraumatic.     Right Ear: Tympanic membrane and ear canal normal.     Left Ear: Tympanic membrane and ear canal normal.  Eyes:     Extraocular Movements: Extraocular movements intact.     Conjunctiva/sclera: Conjunctivae normal.     Pupils: Pupils are equal, round, and reactive to light.  Cardiovascular:     Rate and Rhythm: Normal rate.  Pulmonary:     Effort: Pulmonary effort is normal.  Skin:    General: Skin is warm and dry.  Neurological:     General: No focal deficit present.     Mental Status: She is alert and oriented to person, place, and time.     GCS: GCS eye subscore is 4. GCS verbal subscore is 5. GCS motor subscore is 6.     Cranial  Nerves: No facial asymmetry.  Motor: No weakness.     Coordination: Finger-Nose-Finger Test normal.  Psychiatric:        Mood and Affect: Mood normal.        Behavior: Behavior normal.      UC Treatments / Results  Labs (all labs ordered are listed, but only abnormal results are displayed) Labs Reviewed - No data to display  EKG   Radiology No results found.  Procedures Procedures (including critical care time)  Medications Ordered in UC Medications - No data to display  Initial Impression / Assessment and Plan / UC Course  I have reviewed the triage vital signs and the nursing notes.  Pertinent labs & imaging results that were available during my care of the patient were reviewed by me and considered in my medical decision making (see chart for details).     Reviewed with symptoms.  Exam and symptoms consistent with vertigo.  No red flags. Rx for meclizine 25 mg sent to pharmacy.  Side effect profile reviewed Advised patient to follow-up with ENT for further treatment options of her vertigo if it persists Strict ER precautions reviewed and patient verbalized understanding Final Clinical Impressions(s) / UC Diagnoses   Final diagnoses:  Vertigo     Discharge Instructions      Continue meclizine as needed for your dizziness Please follow-up with the ear nose and throat further treatment options Please go to the ER if you develop any worsening symptoms    ED Prescriptions     Medication Sig Dispense Auth. Provider   meclizine (ANTIVERT) 25 MG tablet Take 1 tablet (25 mg total) by mouth 3 (three) times daily as needed for dizziness. 30 tablet Radford Pax, NP      PDMP not reviewed this encounter.   Radford Pax, NP 07/23/22 [phone]

## 2022-07-23 NOTE — ED Triage Notes (Signed)
Ptpresetnts with c/o constant dizziness and headaches x 1 week. States last night she felt worse and c/o air in her ears.   Home interventions: prescribed medicine for dizziness.

## 2022-07-23 NOTE — Discharge Instructions (Addendum)
Continue meclizine as needed for your dizziness Please follow-up with the ear nose and throat further treatment options Please go to the ER if you develop any worsening symptoms

## 2023-05-14 ENCOUNTER — Ambulatory Visit
Admission: EM | Admit: 2023-05-14 | Discharge: 2023-05-14 | Disposition: A | Discharge status: Home / Self Care | Attending: Emergency Medicine | Admitting: Emergency Medicine

## 2023-05-14 DIAGNOSIS — H1033 Unspecified acute conjunctivitis, bilateral: Secondary | ICD-10-CM

## 2023-05-14 DIAGNOSIS — J069 Acute upper respiratory infection, unspecified: Secondary | ICD-10-CM

## 2023-05-14 MED ORDER — BENZONATATE 100 MG PO CAPS: 100.0000 mg | ORAL_CAPSULE | Freq: Three times a day (TID) | ORAL | 0 refills | Status: AC | PRN

## 2023-05-14 MED ORDER — MOXIFLOXACIN HCL 0.5 % OP SOLN: 1.0000 [drp] | Freq: Three times a day (TID) | OPHTHALMIC | 0 refills | Status: AC

## 2023-05-14 NOTE — ED Provider Notes (Signed)
 [facility]    CSN: [id number] Arrival date & time: 05/14/23  0951      History   Chief Complaint Chief Complaint  Patient presents with   Conjunctivitis    HPI Samantha Suarez is a 38 y.o. female.  4-5 days of  sore throat, ear fullness dry cough. Throat is actually improved today. No fevers but did feel hot. 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    Past Surgical History:  Procedure Laterality Date   CESAREAN SECTION N/A 10/12/2012   Procedure: CESAREAN SECTION;  Surgeon: Mickel Baas, MD;  Location: [facility];  Service: Obstetrics;  Laterality: N/A;   CESAREAN SECTION N/A 10/16/2015   Procedure: CESAREAN SECTION;  Surgeon: Levie Heritage, DO;  Location: [facility];  Service: Obstetrics;  Laterality: N/A;   HYSTEROSCOPY N/A 12/03/2016   Procedure: HYSTEROSCOPY & IUD Removal;  Surgeon: [place] Bing, MD;  Location: [facility];  Service: Gynecology;  Laterality: N/A;    OB History     Gravida  3   Para  2   Term  2   Preterm  0   AB  1   Living  2      SAB  1   IAB  0   Ectopic  0   Multiple  0   Live Births  2            Home Medications    Prior to Admission medications   Medication Sig Start Date End Date Taking? Authorizing Provider  benzonatate (TESSALON) 100 MG capsule Take 1 capsule (100 mg total)  by mouth 3 (three) times daily as needed for cough. 05/14/23  Yes Masey Scheiber, Lurena Joiner, PA-C  moxifloxacin (VIGAMOX) 0.5 % ophthalmic solution Place 1 drop into both eyes 3 (three) times daily. 05/14/23  Yes Merelin Human, Lurena Joiner, PA-C  acetaminophen (TYLENOL) 500 MG tablet Take 500 mg by mouth every 6 (six) hours as needed.    [provider]  cetirizine (ZYRTEC ALLERGY) 10 MG tablet Take 1 tablet (10 mg total) by mouth daily. 07/02/22   Wallis Bamberg, PA-C  ibuprofen (ADVIL) 800 MG tablet Take 1 tablet (800 mg total) by mouth 3 (three) times daily. 01/03/22   Raspet, Noberto Retort, PA-C  meclizine (ANTIVERT) 25 MG tablet Take 1 tablet (25 mg total) by mouth 3 (three) times daily as needed for dizziness. 07/23/22   Radford Pax, NP  Multiple Vitamin (MULTIVITAMIN) tablet Take 1 tablet by mouth daily.    [provider]  mupirocin ointment (BACTROBAN) 2 % Apply 1 Application topically 2 (two) times daily. 01/03/22   Raspet, Noberto Retort, PA-C  ondansetron (ZOFRAN-ODT) 4 MG disintegrating tablet Take 1 tablet (4 mg total) by mouth every 8 (eight) hours as needed for nausea or vomiting. 02/16/19   Janeece Agee, NP    Family History Family History  Problem  Relation Age of Onset   Hyperlipidemia Mother     Social History Social History   Tobacco Use   Smoking status: Never   Smokeless tobacco: Never  Vaping Use   Vaping status: Never Used  Substance Use Topics   Alcohol use: Yes    Comment: occasional   Drug use: No     Allergies   Patient has no known allergies.   Review of Systems Review of Systems Per HPI  Physical Exam Triage Vital Signs ED Triage Vitals  Encounter Vitals Group     BP 05/14/23 1004 108/78     Systolic BP Percentile --      Diastolic BP Percentile --      Pulse Rate 05/14/23 1004 82     Resp 05/14/23 1004 16     Temp 05/14/23 1004 98.1 F (36.7 C)     Temp Source 05/14/23 1004 Oral     SpO2 05/14/23 1004 99 %     Weight --      Height --      Head  Circumference --      Peak Flow --      Pain Score 05/14/23 1003 0     Pain Loc --      Pain Education --      Exclude from Growth Chart --    No data found.  Updated Vital Signs BP 108/78 (BP Location: Left Arm)   Pulse 82   Temp 98.1 F (36.7 C) (Oral)   Resp 16   LMP 04/20/2023 (Approximate)   SpO2 99%   Visual Acuity Right Eye Distance:   Left Eye Distance:   Bilateral Distance:    Right Eye Near:   Left Eye Near:    Bilateral Near:     Physical Exam Vitals and nursing note reviewed.  Constitutional:      Appearance: She is not ill-appearing.  HENT:     Right Ear: Tympanic membrane and ear canal normal.     Left Ear: Tympanic membrane and ear canal normal.     Nose: No congestion or rhinorrhea.     Mouth/Throat:     Mouth: Mucous membranes are moist.     Pharynx: Oropharynx is clear. No posterior oropharyngeal erythema.  Eyes:     General: Lids are normal. Lids are everted, no foreign bodies appreciated. Vision grossly intact.     Extraocular Movements:     Right eye: Normal extraocular motion.     Left eye: Normal extraocular motion.     Conjunctiva/sclera:     Right eye: Right conjunctiva is injected.     Left eye: Left conjunctiva is injected.     Comments: Bilateral conjunctiva mildly injected.  No pain with EOM  Cardiovascular:     Rate and Rhythm: Normal rate and regular rhythm.     Pulses: Normal pulses.     Heart sounds: Normal heart sounds.  Pulmonary:     Effort: Pulmonary effort is normal.     Breath sounds: Normal breath sounds. No wheezing or rales.  Musculoskeletal:     Cervical back: Normal range of motion.  Lymphadenopathy:     Cervical: No cervical adenopathy.  Skin:    General: Skin is warm and dry.  Neurological:     Mental Status: She is alert and oriented to person, place, and time.      UC Treatments / Results  Labs (all labs ordered are listed, but only abnormal results are displayed) Labs Reviewed -  No data to  display  EKG   Radiology No results found.  Procedures Procedures (including critical care time)  Medications Ordered in UC Medications - No data to display  Initial Impression / Assessment and Plan / UC Course  I have reviewed the triage vital signs and the nursing notes.  Pertinent labs & imaging results that were available during my care of the patient were reviewed by me and considered in my medical decision making (see chart for details).  Acute bacterial conjunctivitis Vigamox 3 times daily for 7 days  Viral URI Tessalon sent for cough.  Advised other symptomatic and supportive care.  Advise reasons to return to clinic.  Patient agrees to plan, all questions answered.  A note for work is provided.  Final Clinical Impressions(s) / UC Diagnoses   Final diagnoses:  Acute bacterial conjunctivitis of both eyes  Viral URI with cough     Discharge Instructions      Eyedrops 1 drop in each eye, 3 times daily for 7 days The tessalon cough pills can be taken 3x daily. If this medication makes you drowsy, take only one pill before bed. Continue allegra Drink lots of fluids!     ED Prescriptions     Medication Sig Dispense Auth. Provider   benzonatate (TESSALON) 100 MG capsule Take 1 capsule (100 mg total) by mouth 3 (three) times daily as needed for cough. 30 capsule Brayton Baumgartner, PA-C   moxifloxacin (VIGAMOX) 0.5 % ophthalmic solution Place 1 drop into both eyes 3 (three) times daily. 3 mL Lakai Moree, Lurena Joiner, PA-C      PDMP not reviewed this encounter.   Marlow Baars, [place] 05/14/23 1036

## 2023-05-14 NOTE — Discharge Instructions (Signed)
 Eyedrops 1 drop in each eye, 3 times daily for 7 days The tessalon cough pills can be taken 3x daily. If this medication makes you drowsy, take only one pill before bed. Continue allegra Drink lots of fluids!

## 2023-05-14 NOTE — ED Triage Notes (Signed)
 Pt states cough,sore throat and ear pain.  States she woke up this morning with eye redness and discharge.

## 2023-06-27 ENCOUNTER — Ambulatory Visit
Admission: EM | Admit: 2023-06-27 | Discharge: 2023-06-27 | Disposition: A | Discharge status: Home / Self Care | Payer: Self-pay | Attending: Family Medicine | Admitting: Family Medicine

## 2023-06-27 DIAGNOSIS — N898 Other specified noninflammatory disorders of vagina: Secondary | ICD-10-CM | POA: Insufficient documentation

## 2023-06-27 DIAGNOSIS — N3001 Acute cystitis with hematuria: Secondary | ICD-10-CM | POA: Insufficient documentation

## 2023-06-27 LAB — POCT URINALYSIS DIP (MANUAL ENTRY)
Bilirubin, UA: NEGATIVE
Glucose, UA: NEGATIVE mg/dL
Ketones, POC UA: NEGATIVE mg/dL
Nitrite, UA: POSITIVE — AB
Protein Ur, POC: >=300 mg/dL — AB
Spec Grav, UA: 1.01 (ref 1.010–1.025)
Urobilinogen, UA: 0.2 U/dL
pH, UA: 7.5 (ref 5.0–8.0)

## 2023-06-27 LAB — POCT URINE PREGNANCY: Preg Test, Ur: NEGATIVE

## 2023-06-27 MED ORDER — CEPHALEXIN 500 MG PO CAPS: 500.0000 mg | ORAL_CAPSULE | Freq: Two times a day (BID) | ORAL | 0 refills | Status: AC

## 2023-06-27 NOTE — ED Triage Notes (Signed)
 Pt presents with abdomen pain and vaginal burning when she urinate x 5 days. Treated with Azo w/o relief.

## 2023-06-27 NOTE — ED Provider Notes (Signed)
 [facility]  Note:  This document was prepared using Conservation officer, historic buildings and may include unintentional dictation errors.  MRN: [id number] DOB: 10/27/1985  Subjective:   Samantha Suarez is a 38 y.o. female presenting for 5 day history of persistent dysuria, vaginal irritation, urinary frequency and urgency.  Patient has been trying to hydrate more with water, and is also drinking cranberry juice.  Generally she avoids urinary irritants.  Patient is not as concerned about sexually transmitted infection but would like to run vaginal cytology anyway.  No current facility-administered medications for this encounter.  Current Outpatient Medications:    acetaminophen  (TYLENOL ) 500 MG tablet, Take 500 mg by mouth every 6 (six) hours as needed., Disp: , Rfl:    benzonatate  (TESSALON ) 100 MG capsule, Take 1 capsule (100 mg total) by mouth 3 (three) times daily as needed for cough., Disp: 30 capsule, Rfl: 0   cetirizine  (ZYRTEC  ALLERGY) 10 MG tablet, Take 1 tablet (10 mg total) by mouth daily., Disp: 30 tablet, Rfl: 0   ibuprofen  (ADVIL ) 800 MG tablet, Take 1 tablet (800 mg total) by mouth 3 (three) times daily., Disp: 15 tablet, Rfl: 0   meclizine  (ANTIVERT ) 25 MG tablet, Take 1 tablet (25 mg total) by mouth 3 (three) times daily as needed for dizziness., Disp: 30 tablet, Rfl: 0   moxifloxacin  (VIGAMOX ) 0.5 % ophthalmic solution, Place 1 drop into both eyes 3 (three) times daily., Disp: 3 mL, Rfl: 0   Multiple Vitamin (MULTIVITAMIN) tablet, Take 1 tablet by mouth daily., Disp: , Rfl:    mupirocin  ointment (BACTROBAN ) 2 %, Apply 1 Application topically 2 (two) times daily., Disp: 22 g, Rfl: 0   ondansetron  (ZOFRAN -ODT) 4 MG disintegrating tablet, Take 1 tablet (4 mg total) by mouth every 8 (eight) hours as needed for nausea or vomiting., Disp: 30 tablet, Rfl: 0   No Known Allergies  Past Medical History:  Diagnosis Date   Abnormal Pap smear of cervix 05/13/2013    ASC-US  +HPV   Gestational diabetes    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   Seen on 12/2016 hysteroscopy     Past Surgical History:  Procedure Laterality Date   CESAREAN SECTION N/A 10/12/2012   Procedure: CESAREAN SECTION;  Surgeon: Carvel Clarity, MD;  Location: [facility];  Service: Obstetrics;  Laterality: N/A;   CESAREAN SECTION N/A 10/16/2015   Procedure: CESAREAN SECTION;  Surgeon: Malka Sea, DO;  Location: [facility];  Service: Obstetrics;  Laterality: N/A;   HYSTEROSCOPY N/A 12/03/2016   Procedure: HYSTEROSCOPY & IUD Removal;  Surgeon: Raynell Caller, MD;  Location: [facility];  Service: Gynecology;  Laterality: N/A;    Family History  Problem Relation Age of Onset   Hyperlipidemia Mother     Social History   Tobacco Use   Smoking status: Never   Smokeless tobacco: Never  Vaping Use   Vaping status: Never Used  Substance Use Topics   Alcohol use: Yes    Comment: occasional   Drug use: No    ROS   Objective:   Vitals: BP 106/74 (BP Location: Left Arm)   Pulse 89   Temp 98.5 F (36.9 C) (Oral)   Ht 4\' 9"  (1.448 m)   Wt 125 lb (56.7 kg)   LMP 06/13/2023 (Exact Date)   SpO2 95%   BMI 27.05 kg/m   Physical Exam Constitutional:      General: She is not in acute distress.    Appearance: Normal appearance.  She is well-developed. She is not ill-appearing, toxic-appearing or diaphoretic.  HENT:     Head: Normocephalic and atraumatic.     Nose: Nose normal.     Mouth/Throat:     Mouth: Mucous membranes are moist.  Eyes:     General: No scleral icterus.       Right eye: No discharge.        Left eye: No discharge.     Extraocular Movements: Extraocular movements intact.     Conjunctiva/sclera: Conjunctivae normal.  Cardiovascular:     Rate and Rhythm: Normal rate.  Pulmonary:     Effort: Pulmonary effort is normal.  Abdominal:     General: Bowel sounds are normal. There is no distension.     Palpations: Abdomen is soft. There is  no mass.     Tenderness: There is no abdominal tenderness. There is no right CVA tenderness, left CVA tenderness, guarding or rebound.  Skin:    General: Skin is warm and dry.  Neurological:     General: No focal deficit present.     Mental Status: She is alert and oriented to person, place, and time.  Psychiatric:        Mood and Affect: Mood normal.        Behavior: Behavior normal.        Thought Content: Thought content normal.        Judgment: Judgment normal.     Results for orders placed or performed during the hospital encounter of 06/27/23 (from the past 24 hours)  POCT urinalysis dipstick     Status: Abnormal   Collection Time: 06/27/23  8:26 AM  Result Value Ref Range   Color, UA other (A) yellow   Clarity, UA hazy (A) clear   Glucose, UA negative negative mg/dL   Bilirubin, UA negative negative   Ketones, POC UA negative negative mg/dL   Spec Grav, UA 5.956 3.875 - 1.025   Blood, UA large (A) negative   pH, UA 7.5 5.0 - 8.0   Protein Ur, POC >=300 (A) negative mg/dL   Urobilinogen, UA 0.2 0.2 or 1.0 E.U./dL   Nitrite, UA Positive (A) Negative   Leukocytes, UA Moderate (2+) (A) Negative  POCT urine pregnancy     Status: None   Collection Time: 06/27/23  8:27 AM  Result Value Ref Range   Preg Test, Ur Negative Negative    Assessment and Plan :   PDMP not reviewed this encounter.  1. Acute cystitis with hematuria   2. Vaginal irritation    Start cephalexin to cover for acute cystitis, urine culture and vaginal cytology pending.  Recommended aggressive hydration, limiting urinary irritants. Counseled patient on potential for adverse effects with medications prescribed/recommended today, ER and return-to-clinic precautions discussed, patient verbalized understanding.    Adolph Hoop, [place] 06/27/23 [phone]

## 2023-06-27 NOTE — Discharge Instructions (Addendum)

## 2023-06-29 LAB — CERVICOVAGINAL ANCILLARY ONLY
Bacterial Vaginitis (gardnerella): NEGATIVE
Candida Glabrata: NEGATIVE
Candida Vaginitis: NEGATIVE
Chlamydia: NEGATIVE
Comment: NEGATIVE
Comment: NEGATIVE
Comment: NEGATIVE
Comment: NEGATIVE
Comment: NEGATIVE
Comment: NORMAL
Neisseria Gonorrhea: NEGATIVE
Trichomonas: NEGATIVE

## 2023-06-29 LAB — URINE CULTURE: Culture: 10000 — AB
